# Patient Record
Sex: Female | Born: 1945 | Race: White | Hispanic: No | Marital: Married | Smoking: Never smoker
Health system: Southern US, Community
[De-identification: ages and names within clinical notes are randomized; demographics above are authoritative.]

## PROBLEM LIST (undated history)

## (undated) DIAGNOSIS — Z9109 Other allergy status, other than to drugs and biological substances: Secondary | ICD-10-CM

## (undated) DIAGNOSIS — E785 Hyperlipidemia, unspecified: Secondary | ICD-10-CM

## (undated) DIAGNOSIS — I10 Essential (primary) hypertension: Secondary | ICD-10-CM

## (undated) DIAGNOSIS — M1990 Unspecified osteoarthritis, unspecified site: Secondary | ICD-10-CM

## (undated) HISTORY — PX: CATARACT EXTRACTION, BILATERAL: SHX1313

## (undated) HISTORY — DX: Unspecified osteoarthritis, unspecified site: M19.90

## (undated) HISTORY — DX: Essential (primary) hypertension: I10

## (undated) HISTORY — DX: Hyperlipidemia, unspecified: E78.5

## (undated) HISTORY — PX: RETINAL DETACHMENT SURGERY: SHX105

## (undated) HISTORY — PX: EXPLORATORY LAPAROTOMY: SUR591

## (undated) HISTORY — DX: Other allergy status, other than to drugs and biological substances: Z91.09

---

## 1977-10-30 HISTORY — PX: TENDON REPAIR: SHX5111

## 1983-10-31 HISTORY — PX: PARTIAL HYSTERECTOMY: SHX80

## 2003-10-31 HISTORY — PX: OTHER SURGICAL HISTORY: SHX169

## 2007-08-21 ENCOUNTER — Ambulatory Visit: Payer: Self-pay | Admitting: Ophthalmology

## 2007-08-21 ENCOUNTER — Other Ambulatory Visit: Payer: Self-pay

## 2007-08-28 ENCOUNTER — Ambulatory Visit: Payer: Self-pay | Admitting: Ophthalmology

## 2007-11-12 ENCOUNTER — Ambulatory Visit: Payer: Self-pay | Admitting: Family Medicine

## 2008-11-28 ENCOUNTER — Encounter: Payer: Self-pay | Admitting: Family Medicine

## 2008-12-31 ENCOUNTER — Ambulatory Visit: Payer: Self-pay | Admitting: Family Medicine

## 2008-12-31 DIAGNOSIS — I10 Essential (primary) hypertension: Secondary | ICD-10-CM | POA: Insufficient documentation

## 2008-12-31 DIAGNOSIS — E039 Hypothyroidism, unspecified: Secondary | ICD-10-CM | POA: Insufficient documentation

## 2008-12-31 DIAGNOSIS — E785 Hyperlipidemia, unspecified: Secondary | ICD-10-CM

## 2008-12-31 DIAGNOSIS — M1990 Unspecified osteoarthritis, unspecified site: Secondary | ICD-10-CM | POA: Insufficient documentation

## 2008-12-31 DIAGNOSIS — J309 Allergic rhinitis, unspecified: Secondary | ICD-10-CM | POA: Insufficient documentation

## 2009-01-05 ENCOUNTER — Ambulatory Visit: Payer: Self-pay | Admitting: Family Medicine

## 2009-01-06 LAB — CONVERTED CEMR LAB
AST: 20 units/L (ref 0–37)
Alkaline Phosphatase: 72 units/L (ref 39–117)
Chloride: 107 meq/L (ref 96–112)
GFR calc Af Amer: 108 mL/min
GFR calc non Af Amer: 90 mL/min
LDL Cholesterol: 85 mg/dL (ref 0–99)
Potassium: 4.4 meq/L (ref 3.5–5.1)
Sodium: 141 meq/L (ref 135–145)
Total Bilirubin: 0.8 mg/dL (ref 0.3–1.2)
Total CHOL/HDL Ratio: 3.8

## 2009-01-12 ENCOUNTER — Encounter: Payer: Self-pay | Admitting: Family Medicine

## 2009-03-19 ENCOUNTER — Ambulatory Visit: Payer: Self-pay | Admitting: Family Medicine

## 2009-03-19 LAB — HM COLONOSCOPY

## 2009-03-19 LAB — CONVERTED CEMR LAB
HDL goal, serum: 40 mg/dL
LDL Goal: 130 mg/dL

## 2009-03-26 ENCOUNTER — Ambulatory Visit: Payer: Self-pay | Admitting: Family Medicine

## 2009-03-26 LAB — CONVERTED CEMR LAB: Fecal Occult Bld: POSITIVE

## 2009-05-28 ENCOUNTER — Ambulatory Visit: Payer: Self-pay | Admitting: Family Medicine

## 2009-05-28 ENCOUNTER — Encounter: Payer: Self-pay | Admitting: Family Medicine

## 2009-06-18 ENCOUNTER — Ambulatory Visit: Payer: Self-pay | Admitting: Family Medicine

## 2009-06-21 LAB — CONVERTED CEMR LAB: Glucose, Bld: 99 mg/dL (ref 70–99)

## 2010-01-26 ENCOUNTER — Ambulatory Visit: Payer: Self-pay | Admitting: Orthopedic Surgery

## 2010-02-14 ENCOUNTER — Encounter: Payer: Self-pay | Admitting: Family Medicine

## 2010-06-14 ENCOUNTER — Ambulatory Visit: Payer: Self-pay | Admitting: Family Medicine

## 2010-06-14 DIAGNOSIS — M25569 Pain in unspecified knee: Secondary | ICD-10-CM | POA: Insufficient documentation

## 2010-06-15 LAB — CONVERTED CEMR LAB
AST: 22 units/L (ref 0–37)
Albumin: 4.4 g/dL (ref 3.5–5.2)
Alkaline Phosphatase: 69 units/L (ref 39–117)
Bilirubin, Direct: 0.1 mg/dL (ref 0.0–0.3)
Calcium: 9.4 mg/dL (ref 8.4–10.5)
Cholesterol: 182 mg/dL (ref 0–200)
Creatinine, Ser: 0.8 mg/dL (ref 0.4–1.2)
GFR calc non Af Amer: 80.85 mL/min (ref >60)
Glucose, Bld: 81 mg/dL (ref 70–99)
LDL Cholesterol: 112 mg/dL — ABNORMAL HIGH (ref 0–99)
Sodium: 142 meq/L (ref 135–145)
Total CHOL/HDL Ratio: 4
Triglycerides: 121 mg/dL (ref 0.0–149.0)

## 2010-06-17 ENCOUNTER — Telehealth: Payer: Self-pay | Admitting: Family Medicine

## 2010-06-17 ENCOUNTER — Encounter: Payer: Self-pay | Admitting: Family Medicine

## 2010-08-05 LAB — HM MAMMOGRAPHY: HM Mammogram: NORMAL

## 2010-08-10 ENCOUNTER — Ambulatory Visit: Payer: Self-pay | Admitting: Family Medicine

## 2010-08-10 ENCOUNTER — Encounter: Payer: Self-pay | Admitting: Family Medicine

## 2010-08-15 ENCOUNTER — Encounter (INDEPENDENT_AMBULATORY_CARE_PROVIDER_SITE_OTHER): Payer: Self-pay | Admitting: *Deleted

## 2010-08-16 ENCOUNTER — Encounter: Payer: Self-pay | Admitting: Family Medicine

## 2010-08-16 ENCOUNTER — Ambulatory Visit: Payer: Self-pay | Admitting: Family Medicine

## 2010-08-18 ENCOUNTER — Ambulatory Visit: Payer: Self-pay | Admitting: Family Medicine

## 2010-11-29 NOTE — Medication Information (Signed)
Summary: Clarification for Simvastatin/[facility]  Clarification for Simvastatin/[facility]   Imported By: Lanelle Bal 06/22/2010 11:59:46  _____________________________________________________________________  External Attachment:    Type:   Image     Comment:   External Document

## 2010-11-29 NOTE — Letter (Signed)
Summary: Results Follow up Letter  [facility]  [address] [place], [place] [address]   Phone: [phone]  Fax: [phone]    08/15/2010 MRN: [id number]     Leah Arnold [address] [place], [place]  [address]      Dear Ms. Leah Arnold,  The following are the results of your recent test(s):  Test         Result    Pap Smear:        Normal _____  Not Normal _____ Comments: ______________________________________________________ Cholesterol: LDL(Bad cholesterol):         Your goal is less than:         HDL (Good cholesterol):       Your goal is more than: Comments:  ______________________________________________________ Mammogram:        Normal __x___  Not Normal _____ Comments: Repeat in 1 year  ___________________________________________________________________ Hemoccult:        Normal _____  Not normal _______ Comments:    _____________________________________________________________________ Other Tests:    We routinely do not discuss normal results over the telephone.  If you desire a copy of the results, or you have any questions about this information we can discuss them at your next office visit.   Sincerely,   Kerby Nora MD

## 2010-11-29 NOTE — Assessment & Plan Note (Signed)
Summary: MEDS REFILL / LFW   Vital Signs:  Patient profile:   65 year old female Height:      66.25 inches Weight:      231.0 pounds BMI:     37.14 Temp:     98.7 degrees F oral Pulse rate:   72 / minute Pulse rhythm:   regular BP sitting:   140 / 90  (left arm) Cuff size:   large  Vitals Entered By: Benny Lennert CMA Duncan Dull) (June 14, 2010 9:35 AM)  History of Present Illness: Chief complaint medication refills  Hypothyroidism, well controlled..last year... Due for reeval.   Overdue for CPX.  Currently seeing Podiatrist (Dr. Al Corpus)... has had orthotics placed.  Has seen Ortho in last year...for right knee replacement follow up..knee X-rays stable. Larey Seat several days ago..hit right knee...direct contact.  No head injury. HAs been icing..minimal pain.    Hypertension History:      She complains of peripheral edema, but denies headache, chest pain, PND, syncope, and side effects from treatment.  Has not taken med yet today.Marland KitchenMarland KitchenAt home 123/73.        Positive major cardiovascular risk factors include female age 26 years old or older, hyperlipidemia, and hypertension.  Negative major cardiovascular risk factors include non-tobacco-user status.    Lipid Management History:      Positive NCEP/ATP III risk factors include female age 27 years old or older, HDL cholesterol less than 40, and hypertension.  Negative NCEP/ATP III risk factors include non-tobacco-user status.        Her compliance with the TLC diet is good.  Comments include: Told by her husband's cardiologist to ecrease simvastaitn since on amlodi[pine as well. .      Problems Prior to Update: 1)  Other Screening Mammogram  (ICD-V76.12) 2)  Family History of Cad Female 1st Degree Relative <50  (ICD-V17.3) 3)  Allergic Rhinitis  (ICD-477.9) 4)  Osteoarthritis  (ICD-715.90) 5)  Hypertension  (ICD-401.9) 6)  Hyperlipidemia  (ICD-272.4) 7)  Unspecified Hypothyroidism  (ICD-244.9)  Current Medications (verified): 1)   Amlodipine Besylate 5 Mg Tabs (Amlodipine Besylate) .... Take 1 Tablet By Mouth Once A Day 2)  Simvastatin 40 Mg Tabs (Simvastatin) .... Take 1/2  Tablet By Mouth Once A Day 3)  Synthroid 125 Mcg Tabs (Levothyroxine Sodium) .... Brand Name Only Take 1 Tablet By Mouth Once A Day 4)  Glucosamine-Chondroitin   Caps (Glucosamine-Chondroit-Vit C-Mn) .... Take 1 Tablet By Mouth Once A Day 5)  Calcium Carbonate-Vitamin D 600-400 Mg-Unit  Tabs (Calcium Carbonate-Vitamin D) .... Take 1 Tablet By Mouth Once A Day 6)  Benazepril Hcl 10 Mg Tabs (Benazepril Hcl) .... Take 1 Tablet By Mouth Once A Day  Allergies: 1)  ! Demerol  Past History:  Past medical, surgical, family and social histories (including risk factors) reviewed, and no changes noted (except as noted below).  Past Medical History: Reviewed history from 12/31/2008 and no changes required. Hyperlipidemia Hypertension Osteoarthritis Allergic rhinitis  Past Surgical History: Reviewed history from 12/31/2008 and no changes required. 2005 right knee replacement 1979 tendon repair left wrist 1985 partial hysterectomy, left ovary remains for endometriosis and fibroids 1990 exp lap, LOA 1992/1993 carpal tunnel 2008 detached retina, macular hole cataract repair B  Family History: Reviewed history from 12/31/2008 and no changes required. mother: osteoporosis, RA father: massive MI age 20, COPD, alcoholic, esophageal cancer 2 sister: imbedded appendix both  thyroid issues Family History of CAD Female 1st degree relative <50 PGF: CAD  Social History: Reviewed  history from 12/31/2008 and no changes required. Occupation: retired Dispensing optician Married 2 children Never Smoked Alcohol use-yes, wine monthly Drug use-no Regular exercise-yes, bicycles three to 7  times a week  Review of Systems General:  Denies fatigue and fever. CV:  Denies chest pain or discomfort. Resp:  Denies shortness of breath. GI:  Denies abdominal  pain. GU:  Denies dysuria. MS:  Denies low back pain, mid back pain, muscle aches, muscle, and cramps. Psych:  Denies anxiety and depression; Some increase in stress lately with her husband's health.  Physical Exam  General:  overweight female in NAD Mouth:  Oral mucosa and oropharynx without lesions or exudates.  Teeth in good repair. Neck:  no carotid bruit or thyromegaly no cervical or supraclavicular lymphadenopathy  Lungs:  Normal respiratory effort, chest expands symmetrically. Lungs are clear to auscultation, no crackles or wheezes. Heart:  Normal rate and regular rhythm. S1 and S2 normal without gallop, murmur, click, rub or other extra sounds. Abdomen:  Bowel sounds positive,abdomen soft and non-tender without masses, organomegaly or hernias noted. Msk:  contusion on right anterior knee..neg ant post drawer, ligaments stable, no joint line tenderness, full ROM Pulses:  R and L posterior tibial pulses are full and equal bilaterally  Extremities:  1+ left pedal edema and trace right pedal edema.     Impression & Recommendations:  Problem # 1:  KNEE PAIN, RIGHT (ICD-719.46) Contusion. Prostesis appears stable.  Elevate, ice, time.   Problem # 2:  HYPERTENSION (ICD-401.9) Well controlled. Continue current medication.  Her updated medication list for this problem includes:    Amlodipine Besylate 5 Mg Tabs (Amlodipine besylate) .Marland Kitchen... Take 1 tablet by mouth once a day    Benazepril Hcl 10 Mg Tabs (Benazepril hcl) .Marland Kitchen... Take 1 tablet by mouth once a day  Orders: TLB-BMP (Basic Metabolic Panel-BMET) (80048-METABOL) TLB-Lipid Panel (80061-LIPID) TLB-Hepatic/Liver Function Pnl (80076-HEPATIC)  Problem # 3:  HYPERLIPIDEMIA (ICD-272.4) On lower dose simvastain due to interaction with CaChannel blocker..due for reeval to see if remains controlled.  Her updated medication list for this problem includes:    Simvastatin 40 Mg Tabs (Simvastatin) .Marland Kitchen... Take 1/2  tablet by mouth once a  day  Problem # 4:  UNSPECIFIED HYPOTHYROIDISM (ICD-244.9) Due for reeval.  Her updated medication list for this problem includes:    Synthroid 125 Mcg Tabs (Levothyroxine sodium) ..... Brand name only take 1 tablet by mouth once a day  Orders: TLB-TSH (Thyroid Stimulating Hormone) (84443-TSH)  Complete Medication List: 1)  Amlodipine Besylate 5 Mg Tabs (Amlodipine besylate) .... Take 1 tablet by mouth once a day 2)  Simvastatin 40 Mg Tabs (Simvastatin) .... Take 1/2  tablet by mouth once a day 3)  Synthroid 125 Mcg Tabs (Levothyroxine sodium) .... Brand name only take 1 tablet by mouth once a day 4)  Glucosamine-chondroitin Caps (Glucosamine-chondroit-vit c-mn) .... Take 1 tablet by mouth once a day 5)  Calcium Carbonate-vitamin D 600-400 Mg-unit Tabs (Calcium carbonate-vitamin d) .... Take 1 tablet by mouth once a day 6)  Benazepril Hcl 10 Mg Tabs (Benazepril hcl) .... Take 1 tablet by mouth once a day  Other Orders: Radiology Referral (Radiology)  Hypertension Assessment/Plan:      The patient's hypertensive risk group is category B: At least one risk factor (excluding diabetes) with no target organ damage.  Her calculated 10 year risk of coronary heart disease is 13 %.  Today's blood pressure is 140/90.  Her blood pressure goal is < 140/90.  Lipid  Assessment/Plan:      Based on NCEP/ATP III, the patient's risk factor category is "2 or more risk factors and a calculated 10 year CAD risk of < 20%".  The patient's lipid goals are as follows: Total cholesterol goal is 200; LDL cholesterol goal is 130; HDL cholesterol goal is 40; Triglyceride goal is 150.  Her LDL cholesterol goal has been met.    Patient Instructions: 1)  Schedule complete physical exam in next 1-2 month. 2)  We will refill you meds when labs are back.  3)  Referral Appointment Information 4)  Day/Date: 5)  Time: 6)  Place/MD: 7)  Address: 8)  Phone/Fax: 9)  Patient given appointment information. Information/Orders  faxed/mailed.   Current Allergies (reviewed today): ! DEMEROL

## 2010-11-29 NOTE — Progress Notes (Signed)
Summary: forms regarding amlodipine and simvastatin  Phone Note From Pharmacy   Caller: [facility] Summary of Call: Form from [facility] regarding drug interaction between amlodipine and simvastatin is on your desk.  Also, there is another form regarding quantity on simvastatin. Initial call taken by: Lowella Petties CMA,  June 17, 2010 9:42 AM  Follow-up for Phone Call        She has already decreased dose. will address dosing form.  Follow-up by: Kerby Nora MD,  June 17, 2010 9:56 AM

## 2010-11-29 NOTE — Assessment & Plan Note (Signed)
Summary: BEDSOLE FLU SHOT  Nurse Visit   Vitals Entered By: Benny Lennert CMA Duncan Dull) (August 18, 2010 10:40 AM)  Allergies: 1)  ! Demerol  Immunizations Administered:  Influenza Vaccine # 1:    Vaccine Type: Fluvax MCR    Site: left deltoid    Mfr: GlaxoSmithKline    Dose: 0.5 ml    Route: IM    Given by: Benny Lennert CMA (AAMA)    Exp. Date: 04/29/2011    Lot #: [id number]    VIS given: 05/24/10 version given August 18, 2010.  Flu Vaccine Consent Questions:    Do you have a history of severe allergic reactions to this vaccine? no    Any prior history of allergic reactions to egg and/or gelatin? no    Do you have a sensitivity to the preservative Thimersol? no    Do you have a past history of Guillan-Barre Syndrome? no    Do you currently have an acute febrile illness? no    Have you ever had a severe reaction to latex? no    Vaccine information given and explained to patient? yes    Are you currently pregnant? no  Orders Added: 1)  Influenza Vaccine MCR [00025] 2)  Flu Vaccine 51yrs + MEDICARE PATIENTS [Q2039]

## 2011-04-17 ENCOUNTER — Encounter: Payer: Self-pay | Admitting: Family Medicine

## 2011-04-17 ENCOUNTER — Ambulatory Visit (INDEPENDENT_AMBULATORY_CARE_PROVIDER_SITE_OTHER): Payer: Medicare PPO | Admitting: Family Medicine

## 2011-04-17 VITALS — BP 130/80 | HR 107 | Temp 98.0°F | Ht 66.5 in | Wt 231.0 lb

## 2011-04-17 DIAGNOSIS — N12 Tubulo-interstitial nephritis, not specified as acute or chronic: Secondary | ICD-10-CM

## 2011-04-17 DIAGNOSIS — R300 Dysuria: Secondary | ICD-10-CM

## 2011-04-17 DIAGNOSIS — N390 Urinary tract infection, site not specified: Secondary | ICD-10-CM

## 2011-04-17 LAB — POCT URINALYSIS DIPSTICK
Bilirubin, UA: NEGATIVE
Ketones, UA: NEGATIVE
Protein, UA: 30
Spec Grav, UA: 1.015
pH, UA: 7.5

## 2011-04-17 MED ORDER — CIPROFLOXACIN HCL 250 MG PO TABS: 250.0000 mg | ORAL_TABLET | Freq: Two times a day (BID) | ORAL | Status: AC

## 2011-04-17 NOTE — Progress Notes (Signed)
65 year old with UTI symptoms:  Feels like a truck ran over her, burning when she goes - small amounts of urine when she goes. Cranberry and has gotten worse and worse.  Subjective:    Leah Arnold is a 65 y.o. female who complains of burning with urination, dysuria, frequency, hesitancy and incomplete bladder emptying. She has had symptoms for 4 days. Patient also complains of back pain. Patient denies congestion, cough, fever, headache, rhinitis, sorethroat, stomach ache and vaginal discharge. Patient does not have a history of recurrent UTI. Patient does have a history of pyelonephritis.   Patient Active Problem List  Diagnoses  . UNSPECIFIED HYPOTHYROIDISM  . HYPERLIPIDEMIA  . HYPERTENSION  . ALLERGIC RHINITIS  . OSTEOARTHRITIS  . KNEE PAIN, RIGHT   Past Medical History  Diagnosis Date  . Pollen allergies   . HTN (hypertension)   . Osteoarthritis   . Hyperlipidemia    Past Surgical History  Procedure Date  . Right knee replacement 2005  . Tendon repair 1979    left wrist  . Partial hysterectomy 1985    Left ovary remains for endometriosis and fibroids  . Exploratory laparotomy   . Retinal detachment surgery   . Cataract extraction, bilateral    History  Substance Use Topics  . Smoking status: Never Smoker   . Smokeless tobacco: Not on file  . Alcohol Use: Yes   Family History  Problem Relation Age of Onset  . Osteoporosis Mother   . Arthritis Mother   . Alcohol abuse Father   . COPD Father   . Cancer Father     esophageal  . Heart attack Father 49    massive  . Coronary artery disease Paternal Grandfather    Allergies  Allergen Reactions  . Meperidine Hcl     REACTION: Nausea   No current outpatient prescriptions on file prior to visit.     Review of Systems ROS: GEN: Acute illness details above GI: Tolerating PO intake GU: maintaining adequate hydration and urination Pulm: No SOB Interactive and getting along well at home.  Otherwise, ROS is as  per the HPI.    Objective:    Physical Exam  Blood pressure 130/80, pulse 107, temperature 98 F (36.7 C), temperature source Oral, height 5' 6.5" (1.689 m), weight 231 lb (104.781 kg), SpO2 98.00%.  GEN: WDWN, A&Ox4,NAD. Non-toxic HEENT: Atraumatc, normocephalic. CV: RRR, No M/G/R PULM: CTA B, No wheezes, crackles, or rhonchi ABD: S, NT, ND, +BS, no rebound. No CVAT. No suprapubic tenderness. Mild CVAT EXT: No c/c/e    Laboratory:  Urine dipstick: see UA, + lg blood and le.   Micro exam: not done.    Assessment:    Acute cystitis   May be early pyelo - will treat longer   Plan:    Medications: ciprofloxacin.  fluids

## 2011-08-03 ENCOUNTER — Encounter: Payer: Self-pay | Admitting: Family Medicine

## 2011-08-03 ENCOUNTER — Ambulatory Visit (INDEPENDENT_AMBULATORY_CARE_PROVIDER_SITE_OTHER): Payer: Medicare PPO | Admitting: Family Medicine

## 2011-08-03 VITALS — BP 130/70 | HR 57 | Temp 98.7°F | Ht 66.0 in | Wt 224.8 lb

## 2011-08-03 DIAGNOSIS — I10 Essential (primary) hypertension: Secondary | ICD-10-CM

## 2011-08-03 DIAGNOSIS — E039 Hypothyroidism, unspecified: Secondary | ICD-10-CM

## 2011-08-03 DIAGNOSIS — Z23 Encounter for immunization: Secondary | ICD-10-CM

## 2011-08-03 DIAGNOSIS — Z1231 Encounter for screening mammogram for malignant neoplasm of breast: Secondary | ICD-10-CM

## 2011-08-03 DIAGNOSIS — E785 Hyperlipidemia, unspecified: Secondary | ICD-10-CM

## 2011-08-03 LAB — COMPREHENSIVE METABOLIC PANEL
Alkaline Phosphatase: 74 U/L (ref 39–117)
CO2: 27 mEq/L (ref 19–32)
Creatinine, Ser: 0.7 mg/dL (ref 0.4–1.2)
GFR: 83.09 mL/min (ref >60.00)
Glucose, Bld: 104 mg/dL — ABNORMAL HIGH (ref 70–99)
Sodium: 142 mEq/L (ref 135–145)
Total Bilirubin: 0.6 mg/dL (ref 0.3–1.2)
Total Protein: 7.5 g/dL (ref 6.0–8.3)

## 2011-08-03 LAB — LIPID PANEL
HDL: 49.7 mg/dL (ref >39.00)
Total CHOL/HDL Ratio: 3
Triglycerides: 90 mg/dL (ref 0.0–149.0)
VLDL: 18 mg/dL (ref 0.0–40.0)

## 2011-08-03 LAB — TSH: TSH: 2.23 u[IU]/mL (ref 0.35–5.50)

## 2011-08-03 MED ORDER — BENAZEPRIL HCL 10 MG PO TABS: 10.0000 mg | ORAL_TABLET | Freq: Every day | ORAL | Status: DC

## 2011-08-03 MED ORDER — AMLODIPINE BESYLATE 5 MG PO TABS: 5.0000 mg | ORAL_TABLET | Freq: Every day | ORAL | Status: DC

## 2011-08-03 MED ORDER — LEVOTHYROXINE SODIUM 125 MCG PO TABS: 125.0000 ug | ORAL_TABLET | Freq: Every day | ORAL | Status: DC

## 2011-08-03 NOTE — Assessment & Plan Note (Signed)
Due for re-eval. 

## 2011-08-03 NOTE — Assessment & Plan Note (Signed)
Well controlled. Continue current medication. If BPs continue to come down with weight loss.. We may be able to decrease medication.

## 2011-08-03 NOTE — Progress Notes (Signed)
  Subjective:    Patient ID: Leah Arnold, female    DOB: May 29, 1946, 65 y.o.   MRN: [id number]  HPI  Hypertension:  Well controlled on benazapril and amlodipine. Using medication without problems or lightheadedness: None Chest pain with exertion:None Edema:None Short of breath:None Average home BPs: 110-120/70s Other issues: Has been working on weight loss and lifestyle change. Has lost 8 lbs since last OV.  Elevated Cholesterol: Due for recheck... On 20 mg daily of simvastatin. Cannot increase given on amlodipine. Using medications without problems:None Muscle aches: None Diet compliance: Great Exercise:Great Other complaints:  Hypothyroid, Stable on current dose. Lab Results  Component Value Date   TSH 5.33 06/14/2010   Over due for yearly physical...will schedule AMW.    Review of Systems  Constitutional: Negative for fever and fatigue.  HENT: Negative for ear pain.   Eyes: Negative for pain.  Respiratory: Negative for chest tightness and shortness of breath.   Cardiovascular: Negative for chest pain, palpitations and leg swelling.  Gastrointestinal: Negative for abdominal pain.  Genitourinary: Negative for dysuria.       Objective:   Physical Exam  Constitutional: Vital signs are normal. She appears well-developed and well-nourished. She is cooperative.  Non-toxic appearance. She does not appear ill. No distress.  HENT:  Head: Normocephalic.  Right Ear: Hearing, tympanic membrane, external ear and ear canal normal. Tympanic membrane is not erythematous, not retracted and not bulging.  Left Ear: Hearing, tympanic membrane, external ear and ear canal normal. Tympanic membrane is not erythematous, not retracted and not bulging.  Nose: No mucosal edema or rhinorrhea. Right sinus exhibits no maxillary sinus tenderness and no frontal sinus tenderness. Left sinus exhibits no maxillary sinus tenderness and no frontal sinus tenderness.  Mouth/Throat: Uvula is midline, oropharynx  is clear and moist and mucous membranes are normal.  Eyes: Conjunctivae, EOM and lids are normal. Pupils are equal, round, and reactive to light. No foreign bodies found.  Neck: Trachea normal and normal range of motion. Neck supple. Carotid bruit is not present. No mass and no thyromegaly present.  Cardiovascular: Normal rate, regular rhythm, S1 normal, S2 normal, normal heart sounds, intact distal pulses and normal pulses.  Exam reveals no gallop and no friction rub.   No murmur heard.      R 1 plus np edemsa, 2 plus on left  Due to capsulitits in this area  Pulmonary/Chest: Effort normal and breath sounds normal. Not tachypneic. No respiratory distress. She has no decreased breath sounds. She has no wheezes. She has no rhonchi. She has no rales.  Abdominal: Soft. Normal appearance and bowel sounds are normal. There is no tenderness.  Neurological: She is alert.  Skin: Skin is warm, dry and intact. No rash noted.  Psychiatric: Her speech is normal and behavior is normal. Judgment and thought content normal. Her mood appears not anxious. Cognition and memory are normal. She does not exhibit a depressed mood.          Assessment & Plan:

## 2011-08-03 NOTE — Patient Instructions (Addendum)
Stop front desk to set up mammogram.

## 2011-10-04 ENCOUNTER — Ambulatory Visit: Payer: Self-pay | Admitting: Family Medicine

## 2011-10-04 ENCOUNTER — Encounter: Payer: Self-pay | Admitting: Family Medicine

## 2011-10-05 ENCOUNTER — Encounter: Payer: Medicare PPO | Admitting: Family Medicine

## 2011-10-05 ENCOUNTER — Encounter: Payer: Self-pay | Admitting: *Deleted

## 2011-10-30 ENCOUNTER — Encounter: Payer: Medicare PPO | Admitting: Family Medicine

## 2012-08-13 ENCOUNTER — Encounter: Payer: Self-pay | Admitting: Family Medicine

## 2012-08-13 ENCOUNTER — Ambulatory Visit (INDEPENDENT_AMBULATORY_CARE_PROVIDER_SITE_OTHER): Payer: Medicare PPO | Admitting: Family Medicine

## 2012-08-13 VITALS — BP 138/80 | HR 88 | Temp 98.6°F | Wt 197.2 lb

## 2012-08-13 DIAGNOSIS — E039 Hypothyroidism, unspecified: Secondary | ICD-10-CM

## 2012-08-13 DIAGNOSIS — E785 Hyperlipidemia, unspecified: Secondary | ICD-10-CM

## 2012-08-13 DIAGNOSIS — M1990 Unspecified osteoarthritis, unspecified site: Secondary | ICD-10-CM

## 2012-08-13 DIAGNOSIS — Z23 Encounter for immunization: Secondary | ICD-10-CM

## 2012-08-13 DIAGNOSIS — I10 Essential (primary) hypertension: Secondary | ICD-10-CM

## 2012-08-13 LAB — COMPREHENSIVE METABOLIC PANEL
ALT: 14 U/L (ref 0–35)
Albumin: 3.9 g/dL (ref 3.5–5.2)
CO2: 27 mEq/L (ref 19–32)
Calcium: 9.3 mg/dL (ref 8.4–10.5)
Chloride: 106 mEq/L (ref 96–112)
GFR: 74.63 mL/min (ref >60.00)
Glucose, Bld: 86 mg/dL (ref 70–99)
Sodium: 140 mEq/L (ref 135–145)
Total Bilirubin: 0.5 mg/dL (ref 0.3–1.2)
Total Protein: 7.1 g/dL (ref 6.0–8.3)

## 2012-08-13 LAB — LIPID PANEL
Cholesterol: 153 mg/dL (ref 0–200)
VLDL: 14 mg/dL (ref 0.0–40.0)

## 2012-08-13 LAB — TSH: TSH: 0.45 u[IU]/mL (ref 0.35–5.50)

## 2012-08-13 MED ORDER — SIMVASTATIN 40 MG PO TABS: 20.0000 mg | ORAL_TABLET | Freq: Every day | ORAL | Status: DC

## 2012-08-13 MED ORDER — MELOXICAM 7.5 MG PO TABS: 7.5000 mg | ORAL_TABLET | Freq: Every day | ORAL | Status: DC

## 2012-08-13 MED ORDER — LEVOTHYROXINE SODIUM 125 MCG PO TABS: 125.0000 ug | ORAL_TABLET | Freq: Every day | ORAL | Status: DC

## 2012-08-13 MED ORDER — AMLODIPINE BESYLATE 5 MG PO TABS: 5.0000 mg | ORAL_TABLET | Freq: Every day | ORAL | Status: DC

## 2012-08-13 MED ORDER — BENAZEPRIL HCL 10 MG PO TABS: 10.0000 mg | ORAL_TABLET | Freq: Every day | ORAL | Status: DC

## 2012-08-13 NOTE — Patient Instructions (Addendum)
Schedule annual medicare wellness in next few month with no labs prior.  Trial of meloxicam for arthritis pain. Take along with  20-40 mg prilosec. Take with food. Stop if stomach upset.  Can try topical voltaren gel if above regimen not tolerated.

## 2012-08-13 NOTE — Progress Notes (Signed)
Subjective:    Patient ID: Leah Arnold, female    DOB: Jan 19, 1946, 67 y.o.   MRN: [id number]  HPI Had detached retina in left eye, s/p surgery 12/2011.  Husband with parkinson's disease.. Very dependant on her.  Hypertension: Well controlled on benazapril and amlodipine.  Using medication without problems or lightheadedness: None  Chest pain with exertion:None  Edema:None  Short of breath:None  Average home BPs: 120/80 Other issues:  Has lost 25-30 lbs in last year Wt Readings from Last 3 Encounters:  08/13/12 197 lb 4 oz (89.472 kg)  08/03/11 224 lb 12.8 oz (101.969 kg)  04/17/11 231 lb (104.781 kg)     Elevated Cholesterol:  Due for re-check Lab Results  Component Value Date   CHOL 152 08/03/2011   HDL 49.70 08/03/2011   LDLCALC 84 08/03/2011   TRIG 90.0 08/03/2011   CHOLHDL 3 08/03/2011  On 20 mg daily of simvastatin. Cannot increase given on amlodipine.  Using medications without problems:None  Muscle aches: None  Diet compliance: Great  Exercise:Great, indoor bicycle Other complaints:   Hypothyroid, due for re-check Lab Results  Component Value Date   TSH 2.23 08/03/2011     Over due for yearly physical...will schedule AMW.        Review of Systems  Constitutional: Negative for fever and fatigue.  HENT: Negative for ear pain.   Eyes: Negative for pain.  Respiratory: Negative for chest tightness and shortness of breath.   Cardiovascular: Negative for chest pain, palpitations and leg swelling.  Gastrointestinal: Negative for abdominal pain.  Genitourinary: Negative for dysuria.       Objective:   Physical Exam  Constitutional: Vital signs are normal. She appears well-developed and well-nourished. She is cooperative.  Non-toxic appearance. She does not appear ill. No distress.       obesity  HENT:  Head: Normocephalic.  Right Ear: Hearing, tympanic membrane, external ear and ear canal normal. Tympanic membrane is not erythematous, not retracted and not  bulging.  Left Ear: Hearing, tympanic membrane, external ear and ear canal normal. Tympanic membrane is not erythematous, not retracted and not bulging.  Nose: No mucosal edema or rhinorrhea. Right sinus exhibits no maxillary sinus tenderness and no frontal sinus tenderness. Left sinus exhibits no maxillary sinus tenderness and no frontal sinus tenderness.  Mouth/Throat: Uvula is midline, oropharynx is clear and moist and mucous membranes are normal.  Eyes: Conjunctivae normal, EOM and lids are normal. Pupils are equal, round, and reactive to light. No foreign bodies found.  Neck: Trachea normal and normal range of motion. Neck supple. Carotid bruit is not present. No mass and no thyromegaly present.  Cardiovascular: Normal rate, regular rhythm, S1 normal, S2 normal, normal heart sounds, intact distal pulses and normal pulses.  Exam reveals no gallop and no friction rub.   No murmur heard.      B varicose veins No peripheral edema  Pulmonary/Chest: Effort normal and breath sounds normal. Not tachypneic. No respiratory distress. She has no decreased breath sounds. She has no wheezes. She has no rhonchi. She has no rales.  Abdominal: Soft. Normal appearance and bowel sounds are normal. There is no tenderness.  Neurological: She is alert.  Skin: Skin is warm, dry and intact. No rash noted.  Psychiatric: Her speech is normal and behavior is normal. Judgment and thought content normal. Her mood appears not anxious. Cognition and memory are normal. She does not exhibit a depressed mood.          Assessment &  Plan:

## 2012-08-13 NOTE — Assessment & Plan Note (Signed)
Due for re-eval. 

## 2012-08-13 NOTE — Assessment & Plan Note (Signed)
Well controlled. Continue current medication.  

## 2012-08-13 NOTE — Assessment & Plan Note (Signed)
Trial of meloxicam with prilose to avoid stomach irritation she has had with aleve in past. If not tolerating we can try topical votaren gel for [facility] and foot arthritis.

## 2012-08-13 NOTE — Addendum Note (Signed)
Addended by: Sydell Axon C on: 08/13/2012 09:00 AM   Modules accepted: Orders

## 2012-08-15 MED ORDER — SIMVASTATIN 40 MG PO TABS: 20.0000 mg | ORAL_TABLET | Freq: Every day | ORAL | Status: DC

## 2012-08-15 MED ORDER — AMLODIPINE BESYLATE 5 MG PO TABS: 5.0000 mg | ORAL_TABLET | Freq: Every day | ORAL | Status: DC

## 2012-08-15 MED ORDER — BENAZEPRIL HCL 10 MG PO TABS: 10.0000 mg | ORAL_TABLET | Freq: Every day | ORAL | Status: DC

## 2012-08-15 MED ORDER — SYNTHROID 125 MCG PO TABS: 125.0000 ug | ORAL_TABLET | Freq: Every day | ORAL | Status: DC

## 2012-08-15 NOTE — Addendum Note (Signed)
Addended by: Baldomero Lamy on: 08/15/2012 08:53 AM   Modules accepted: Orders

## 2012-08-19 ENCOUNTER — Other Ambulatory Visit: Payer: Self-pay

## 2012-08-19 NOTE — Telephone Encounter (Signed)
Pt said [facility] left message for pt they are needing additional information before [facility] can process refills. Pt is not sure which med [facility] needs more info on. I asked pt to contact [facility] to request info sent to Dr Ermalene Searing. I cannot find a request for add'l info. Pt said thanks a lot and hung up phone. I called pt back and explained that if I knew what additional info was needed I would try to get that info sent back to [facility]. Pt said she is in the middle and she will try to contact [facility] and ask them to refax info to our office. Pt said fax # [facility] left was [phone] when our office finds out what info needs to be sent.

## 2013-08-07 ENCOUNTER — Ambulatory Visit (INDEPENDENT_AMBULATORY_CARE_PROVIDER_SITE_OTHER): Payer: Medicare PPO

## 2013-08-07 DIAGNOSIS — Z23 Encounter for immunization: Secondary | ICD-10-CM

## 2013-08-11 ENCOUNTER — Ambulatory Visit: Payer: Self-pay | Admitting: Podiatry

## 2013-09-18 ENCOUNTER — Other Ambulatory Visit: Payer: Self-pay | Admitting: Family Medicine

## 2014-01-11 ENCOUNTER — Other Ambulatory Visit: Payer: Self-pay | Admitting: Family Medicine

## 2014-01-21 ENCOUNTER — Encounter: Payer: Self-pay | Admitting: Podiatry

## 2014-01-21 ENCOUNTER — Ambulatory Visit (INDEPENDENT_AMBULATORY_CARE_PROVIDER_SITE_OTHER): Payer: Medicare PPO | Admitting: Podiatry

## 2014-01-21 VITALS — BP 136/88 | HR 112 | Resp 16 | Ht 66.0 in | Wt 208.0 lb

## 2014-01-21 DIAGNOSIS — M778 Other enthesopathies, not elsewhere classified: Secondary | ICD-10-CM

## 2014-01-21 DIAGNOSIS — M7750 Other enthesopathy of unspecified foot: Secondary | ICD-10-CM

## 2014-01-21 DIAGNOSIS — M779 Enthesopathy, unspecified: Principal | ICD-10-CM

## 2014-01-21 DIAGNOSIS — M722 Plantar fascial fibromatosis: Secondary | ICD-10-CM

## 2014-01-21 NOTE — Progress Notes (Signed)
Both my feet are hurting , i have plantar fasciitis in the right heel and capsulitis in the other foot.  Objective: Vital signs are stable she is alert and oriented x3. Pulses are palpable bilateral. Edema is positive to the bilateral foot. She has pain on palpation medial continued tubercle of the right heel. She has pain on palpation and in range of motion of the subtalar joint left foot. She also has pain on palpation medial continued tubercle of the left foot. Radiographs confirm plantar fasciitis.  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 Subtalar joint capsulitis left.  Plan: Discussed etiology pathology conservative versus surgical therapies. I injected the subtalar joint of the left foot with Kenalog and local anesthetic. I injected the bilateral heels with Kenalog and local anesthetic.

## 2014-01-26 ENCOUNTER — Ambulatory Visit (INDEPENDENT_AMBULATORY_CARE_PROVIDER_SITE_OTHER)
Admission: RE | Admit: 2014-01-26 | Discharge: 2014-01-26 | Disposition: A | Discharge status: Home / Self Care | Payer: Medicare PPO | Source: Ambulatory Visit | Attending: Family Medicine | Admitting: Family Medicine

## 2014-01-26 ENCOUNTER — Ambulatory Visit (INDEPENDENT_AMBULATORY_CARE_PROVIDER_SITE_OTHER): Payer: Medicare PPO | Admitting: Family Medicine

## 2014-01-26 ENCOUNTER — Encounter: Payer: Self-pay | Admitting: Family Medicine

## 2014-01-26 VITALS — BP 106/68 | HR 94 | Temp 98.0°F | Ht 66.0 in | Wt 205.2 lb

## 2014-01-26 DIAGNOSIS — M25511 Pain in right shoulder: Secondary | ICD-10-CM

## 2014-01-26 DIAGNOSIS — M25519 Pain in unspecified shoulder: Secondary | ICD-10-CM

## 2014-01-26 DIAGNOSIS — R55 Syncope and collapse: Secondary | ICD-10-CM

## 2014-01-26 DIAGNOSIS — S060X9A Concussion with loss of consciousness of unspecified duration, initial encounter: Secondary | ICD-10-CM

## 2014-01-26 DIAGNOSIS — M25571 Pain in right ankle and joints of right foot: Secondary | ICD-10-CM

## 2014-01-26 DIAGNOSIS — M25579 Pain in unspecified ankle and joints of unspecified foot: Secondary | ICD-10-CM

## 2014-01-26 DIAGNOSIS — S060XAA Concussion with loss of consciousness status unknown, initial encounter: Secondary | ICD-10-CM

## 2014-01-26 DIAGNOSIS — E039 Hypothyroidism, unspecified: Secondary | ICD-10-CM

## 2014-01-26 LAB — TSH: TSH: 6.79 u[IU]/mL — AB (ref 0.35–5.50)

## 2014-01-26 MED ORDER — SYNTHROID 125 MCG PO TABS: ORAL_TABLET | ORAL | Status: DC

## 2014-01-26 NOTE — Patient Instructions (Signed)
Follow-up with Dr. Patsy Lager[place], 3 weeks

## 2014-01-26 NOTE — Progress Notes (Signed)
Date:  01/26/2014   Name:  Leah Arnold   DOB:  [birth year]   MRN:  [id number]  Primary Physician:  Kerby Nora, MD   Chief Complaint: Fall   Subjective:   History of Present Illness:  Leah Arnold is a 68 y.o. pleasant patient who presents with the following:  Fall after acute syncopal event after food poisoning with dehydration.  DOI 01/23/2014.  Had been sick with food poisoning, wed - Thursday last week. Had been drinking water. Husband has parkinson's, severe, and she is the primary caregiver. History is obtained from patient and her daughter.   Went to the bathroom, throwing up, and husband came after him. Husband trapped foot - maybe?Micah Flesher to the bathroom, and in some way, she blacked out. Her history is not entirely clear, but she has some amnesia and haziness around the event.   Husband also fell.  She was trying to get him up. He fell on top of her and trapped her foot. She thinks this is how she fell. Minimal memory from around 9 PM then family member arrived early in the night when they could find a phone.   Foot trapped - R LE trapped.  Hit head on the R side.  Shoulder on the right side hurts quite a bit.  R LE extensively bruised and painful.  Spent 30-40 minutes trying to get free.   R lower leg hurting quite a lot.  Declined ER eval from EMT recommendations due to lack of care for husband.  Check TSH. She also reports that she has not been taking her thyroid medication as prescribed. She has been cutting her thyroid tablets and half so that they will last longer. Lab Results  Component Value Date   TSH 6.79* 01/26/2014     ? Concussion: She also has been somewhat hazy according to her daughter. When asked her questions specifically directly, she somewhat answers tangentially. She did have a headache, which has subsided. She has not had any significant nausea or emotional lability. She is not sure about feeling dizzy. She is now using a walker, and normally she does  not. This is primarily secondary to pain. She does not feel safe, she she is using a walker. At this point her headache has resolved. She has no nausea. She has no visual changes. She denies any photophobia or photophobia.  [facility] - branded Synthroid   Patient Active Problem List   Diagnosis Date Noted  . UNSPECIFIED HYPOTHYROIDISM 12/31/2008  . HYPERLIPIDEMIA 12/31/2008  . HYPERTENSION 12/31/2008  . ALLERGIC RHINITIS 12/31/2008  . OSTEOARTHRITIS 12/31/2008    Past Medical History  Diagnosis Date  . Pollen allergies   . HTN (hypertension)   . Osteoarthritis   . Hyperlipidemia     Past Surgical History  Procedure Laterality Date  . Right knee replacement  2005  . Tendon repair  1979    left wrist  . Partial hysterectomy  1985    Left ovary remains for endometriosis and fibroids  . Exploratory laparotomy    . Retinal detachment surgery    . Cataract extraction, bilateral      History   Social History  . Marital Status: Married    Spouse Name: N/A    Number of Children: 2  . Years of Education: N/A   Occupational History  . retired(workers Building services engineer)    Social History Main Topics  . Smoking status: Never Smoker   . Smokeless tobacco: Never Used  . Alcohol Use:  Yes     Comment: occassionally a glass a wine  . Drug Use: No  . Sexual Activity: Not on file   Other Topics Concern  . Not on file   Social History Narrative   Regular exercise--yes--bicycles 3 to 7 times per week    Family History  Problem Relation Age of Onset  . Osteoporosis Mother   . Arthritis Mother   . Alcohol abuse Father   . COPD Father   . Cancer Father     esophageal  . Heart attack Father [id number]    massive  . Coronary artery disease Paternal Grandfather     Allergies  Allergen Reactions  . Meperidine Hcl     REACTION: Nausea    Medication list has been reviewed and updated.  Review of Systems:   GEN: No acute illnesses, no fevers, chills. GI: No n/v/d, eating  normally Pulm: No SOB MSK and neuro changes as above Otherwise, the pertinent positives and negatives are listed above and in the HPI, otherwise a full review of systems has been reviewed and is negative unless noted positive.   Objective:   Physical Examination: BP 106/68  Pulse 94  Temp(Src) 98 F (36.7 C) (Oral)  Ht 5\' 6"  (1.676 m)  Wt 205 lb 4 oz (93.101 kg)  BMI 33.14 kg/m2  Ideal Body Weight: Weight in (lb) to have BMI = 25: 154.6   GEN: WDWN, NAD, Non-toxic, A & O x 3 HEENT: Atraumatic, Normocephalic. Neck supple. No masses, No LAD. Ears and Nose: No external deformity. CV: RRR, No M/G/R. No JVD. No thrill. No extra heart sounds. PULM: CTA B, no wheezes, crackles, rhonchi. No retractions. No resp. distress. No accessory muscle use. EXTR: No c/c/e NEURO Normal gait.  PSYCH: Normally interactive. Conversant. Not depressed or anxious appearing.  Calm demeanor.   Neuro: CN 2-12 grossly intact. PERRLA. EOMI. Sensation intact throughout. Str 5/5 all extremities - R UE eval limited by pain. DTR 2+. No clonus. A and o x 4. Finger nose neg. LE cerebellar function no tested secondary to pain in LE.  PSYCH: Normally interactive. Conversant. Not depressed or anxious appearing.  Calm demeanor.    There is some pain along the proximal humerus. There is also pain at the acromium and at the a.c. Joint. There is no significant pain along the clavicle the RIGHT. Anterior and posterior chest wall as well as a scapular nontender. Full range of motion at the elbow and this is also nontender. Nontender throughout the radius and ulna. The wrist and all bones of the hand appear to be grossly nontender.  Bilaterally, the hips have a normal log roll. They have normal range of motion without significant tenderness. The femurs are grossly palpated as well as the pelvis itself which appears to be stable and is nontender.  There is no significant swelling or tenderness about the knee.  On the RIGHT  side of the tibia there is soft tissue bruising extensively. The fibula and tibia are nontender proximally and along the midshaft.  The distal fibula is tender to palpation. Notably so and an additional area of bruising significantly.  The entirety of both feet and ankles are nontender in terms of bony anatomy with the exception of the lateral malleolus on the RIGHT.  In the RIGHT shoulder as above, and additionally there is restriction of abduction and flexion with 4 minus/5 strength testing. Internal and external range of motion is also 4/5 strength. Additional special testing cannot be undertaken secondary  to pain.  Laboratory and Imaging Data: Dg Shoulder Right  01/26/2014   CLINICAL DATA:  Right shoulder plain, trauma 3 days ago  EXAM: RIGHT SHOULDER - 2+ VIEW  COMPARISON:  None.  FINDINGS: Three views of right shoulder submitted. No acute fracture or subluxation. Glenohumeral joint is preserved.  IMPRESSION: Negative.   Electronically Signed   By: Natasha Mead M.D.   On: 01/26/2014 15:25   Dg Ankle Complete Right  01/26/2014   CLINICAL DATA:  Right ankle injury 3 days ago.  Pain.  EXAM: RIGHT ANKLE - COMPLETE 3+ VIEW  COMPARISON:  07/09/2013  FINDINGS: No fracture. No bone lesion. Ankle mortise is normally space and aligned. There is soft tissue swelling most notable laterally.  IMPRESSION: No fracture or dislocation.   Electronically Signed   By: Amie Portland M.D.   On: 01/26/2014 15:59    Assessment & Plan:   Syncope  Right ankle pain - Plan: DG Ankle Complete Right  Right shoulder pain - Plan: DG Shoulder Right  Unspecified hypothyroidism - Plan: TSH  Closed head injury with concussion  >40 minutes spent in face to face time with patient, >50% spent in counselling or coordination of care:  Complex trauma case in an elderly patient. No fracture.  At the very least soft tissue injury. Restriction in strength about the RIGHT shoulder. Rotator cuff tear, full-thickness versus  partial-thickness cannot be excluded. I will reassess in 3 weeks.  Soft tissue and bone contusion RIGHT lower extremity. Not consistent with occult fracture.  Closed head injury with concussion. Unclear if loss of consciousness occurred. Certainly amnesia and headache. She does still have some mild altered mentation according to her daughter who knows her well, but otherwise normal neuro exam. She does appear to be recovering. I reviewed all this in detail with the patient and her daughter, who will be here in town to assist, and reviewed complete neuro-cognitive and physical rest throughout the remainder of the week. I reviewed red flags as well.  He'll follow up with me in 3 weeks to reassess her shoulder.  No Follow-up on file.  Orders Placed This Encounter  Procedures  . DG Shoulder Right  . DG Ankle Complete Right  . TSH   Patient's Medications  New Prescriptions   No medications on file  Previous Medications   BENAZEPRIL (LOTENSIN) 10 MG TABLET    Take 1 tablet (10 mg total) by mouth daily.   CALCIUM CARBONATE-VITAMIN D (CALCIUM 600+D) 600-400 MG-UNIT PER TABLET    Take 1 tablet by mouth daily.     SIMVASTATIN (ZOCOR) 40 MG TABLET    Take 0.5 tablets (20 mg total) by mouth at bedtime.  Modified Medications   Modified Medication Previous Medication   SYNTHROID 125 MCG TABLET SYNTHROID 125 MCG tablet      TAKE 1 TABLET (125 MCG TOTAL) BY MOUTH DAILY.    TAKE 1 TABLET (125 MCG TOTAL) BY MOUTH DAILY.  Discontinued Medications   NAPROXEN SODIUM (ANAPROX) 220 MG TABLET    Take 220 mg by mouth 2 (two) times daily with a meal.   Patient Instructions  Follow-up with Dr. Patsy Lager, 3 weeks   Signed,  Karleen Hampshire T. Chesley Veasey, MD, CAQ Sports Medicine  [employer] at [facility] [address] [place] [address] Phone: [phone] Fax: [phone]

## 2014-01-26 NOTE — Progress Notes (Signed)
Pre visit review using our clinic review tool, if applicable. No additional management support is needed unless otherwise documented below in the visit note. 

## 2014-02-12 ENCOUNTER — Encounter: Payer: Self-pay | Admitting: Family Medicine

## 2014-02-12 ENCOUNTER — Ambulatory Visit (INDEPENDENT_AMBULATORY_CARE_PROVIDER_SITE_OTHER): Payer: Medicare PPO | Admitting: Family Medicine

## 2014-02-12 VITALS — BP 140/90 | HR 109 | Temp 98.4°F | Ht 66.0 in | Wt 201.0 lb

## 2014-02-12 DIAGNOSIS — S060XAA Concussion with loss of consciousness status unknown, initial encounter: Secondary | ICD-10-CM

## 2014-02-12 DIAGNOSIS — M25511 Pain in right shoulder: Secondary | ICD-10-CM

## 2014-02-12 DIAGNOSIS — S060X9A Concussion with loss of consciousness of unspecified duration, initial encounter: Secondary | ICD-10-CM

## 2014-02-12 DIAGNOSIS — M25579 Pain in unspecified ankle and joints of unspecified foot: Secondary | ICD-10-CM

## 2014-02-12 DIAGNOSIS — M25571 Pain in right ankle and joints of right foot: Secondary | ICD-10-CM

## 2014-02-12 DIAGNOSIS — M25519 Pain in unspecified shoulder: Secondary | ICD-10-CM

## 2014-02-12 NOTE — Progress Notes (Signed)
Date:  02/12/2014   Name:  Leah BaneMyra Arnold   DOB:  1946/05/27   MRN:  [id number]  Primary Physician:  Kerby NoraAmy Bedsole, MD   Chief Complaint: Follow-up   Subjective:   History of Present Illness:  Leah Arnold is a 68 y.o. pleasant patient who presents with the following:  Concussion fully cleared. She denies headache, blurred vision, nausea, and she is fully clear mentally. Her balance is full and normal.  R shoulder injury is fully recovered.   Legs are also nontender, no feel good.  Clear to drive.  01/26/2014 Last OV with Hannah BeatSpencer Rosa Gambale, MD  Fall after acute syncopal event after food poisoning with dehydration.  DOI 01/23/2014.  Had been sick with food poisoning, wed - Thursday last week. Had been drinking water. Husband has parkinson's, severe, and she is the primary caregiver. History is obtained from patient and her daughter.   Went to the bathroom, throwing up, and husband came after him. Husband trapped foot - maybe?Micah Flesher. Went to the bathroom, and in some way, she blacked out. Her history is not entirely clear, but she has some amnesia and haziness around the event.   Husband also fell.  She was trying to get him up. He fell on top of her and trapped her foot. She thinks this is how she fell. Minimal memory from around 9 PM then family member arrived early in the night when they could find a phone.   Foot trapped - R LE trapped.  Hit head on the R side.  Shoulder on the right side hurts quite a bit.  R LE extensively bruised and painful.  Spent 30-40 minutes trying to get free.   R lower leg hurting quite a lot.  Declined ER eval from EMT recommendations due to lack of care for husband.  Check TSH. She also reports that she has not been taking her thyroid medication as prescribed. She has been cutting her thyroid tablets and half so that they will last longer. Lab Results  Component Value Date   TSH 6.79* 01/26/2014     ? Concussion: She also has been somewhat hazy according  to her daughter. When asked her questions specifically directly, she somewhat answers tangentially. She did have a headache, which has subsided. She has not had any significant nausea or emotional lability. She is not sure about feeling dizzy. She is now using a walker, and normally she does not. This is primarily secondary to pain. She does not feel safe, she she is using a walker. At this point her headache has resolved. She has no nausea. She has no visual changes. She denies any photophobia or photophobia.  [facility] - branded Synthroid   Patient Active Problem List   Diagnosis Date Noted  . UNSPECIFIED HYPOTHYROIDISM 12/31/2008  . HYPERLIPIDEMIA 12/31/2008  . HYPERTENSION 12/31/2008  . ALLERGIC RHINITIS 12/31/2008  . OSTEOARTHRITIS 12/31/2008    Past Medical History  Diagnosis Date  . Pollen allergies   . HTN (hypertension)   . Osteoarthritis   . Hyperlipidemia     Past Surgical History  Procedure Laterality Date  . Right knee replacement  2005  . Tendon repair  1979    left wrist  . Partial hysterectomy  1985    Left ovary remains for endometriosis and fibroids  . Exploratory laparotomy    . Retinal detachment surgery    . Cataract extraction, bilateral      History   Social History  . Marital Status: Married  Spouse Name: N/A    Number of Children: 2  . Years of Education: N/A   Occupational History  . retired(workers Building services engineercomp underwriter)    Social History Main Topics  . Smoking status: Never Smoker   . Smokeless tobacco: Never Used  . Alcohol Use: Yes     Comment: occassionally a glass a wine  . Drug Use: No  . Sexual Activity: Not on file   Other Topics Concern  . Not on file   Social History Narrative   Regular exercise--yes--bicycles 3 to 7 times per week    Family History  Problem Relation Age of Onset  . Osteoporosis Mother   . Arthritis Mother   . Alcohol abuse Father   . COPD Father   . Cancer Father     esophageal  . Heart attack Father [id number]     massive  . Coronary artery disease Paternal Grandfather     Allergies  Allergen Reactions  . Meperidine Hcl     REACTION: Nausea    Medication list has been reviewed and updated.  Review of Systems:   GEN: No acute illnesses, no fevers, chills. GI: No n/v/d, eating normally Pulm: No SOB O/w neg  Objective:   Physical Examination: BP 140/90  Pulse 109  Temp(Src) 98.4 F (36.9 C) (Oral)  Ht 5\' 6"  (1.676 m)  Wt 201 lb (91.173 kg)  BMI 32.46 kg/m2  Ideal Body Weight: Weight in (lb) to have BMI = 25: 154.6   GEN: WDWN, NAD, Non-toxic, Alert & Oriented x 3 HEENT: Atraumatic, Normocephalic.  Ears and Nose: No external deformity. EXTR: No clubbing/cyanosis/edema NEURO: Normal gait.  PSYCH: Normally interactive. Conversant. Not depressed or anxious appearing.  Calm demeanor.    Right shoulder is with normal range of motion. Strength is 5/5 throughout. Nontender throughout on examination.  Legs bilaterally are nontender throughout with 2 areas of healing ecchymosis.  Laboratory and Imaging Data: Dg Shoulder Right  01/26/2014   CLINICAL DATA:  Right shoulder plain, trauma 3 days ago  EXAM: RIGHT SHOULDER - 2+ VIEW  COMPARISON:  None.  FINDINGS: Three views of right shoulder submitted. No acute fracture or subluxation. Glenohumeral joint is preserved.  IMPRESSION: Negative.   Electronically Signed   By: Natasha MeadLiviu  Pop M.D.   On: 01/26/2014 15:25   Dg Ankle Complete Right  01/26/2014   CLINICAL DATA:  Right ankle injury 3 days ago.  Pain.  EXAM: RIGHT ANKLE - COMPLETE 3+ VIEW  COMPARISON:  07/09/2013  FINDINGS: No fracture. No bone lesion. Ankle mortise is normally space and aligned. There is soft tissue swelling most notable laterally.  IMPRESSION: No fracture or dislocation.   Electronically Signed   By: Amie Portlandavid  Ormond M.D.   On: 01/26/2014 15:59    Assessment & Plan:   Right shoulder pain  Right ankle pain  Closed head injury with concussion  Fully improved.   Clear  to drive.  Shoulder improved.  Patient's Medications  New Prescriptions   No medications on file  Previous Medications   BENAZEPRIL (LOTENSIN) 10 MG TABLET    Take 1 tablet (10 mg total) by mouth daily.   CALCIUM CARBONATE-VITAMIN D (CALCIUM 600+D) 600-400 MG-UNIT PER TABLET    Take 1 tablet by mouth daily.     SIMVASTATIN (ZOCOR) 40 MG TABLET    Take 0.5 tablets (20 mg total) by mouth at bedtime.   SYNTHROID 125 MCG TABLET    TAKE 1 TABLET (125 MCG TOTAL) BY MOUTH DAILY.  Modified  Medications   No medications on file  Discontinued Medications   No medications on file   There are no Patient Instructions on file for this visit.  Signed,  Elpidio Galea. Yitzhak Awan, MD, CAQ Sports Medicine  [employer] at [facility] [address] [place] [address] Phone: [phone] Fax: [phone]

## 2014-02-12 NOTE — Progress Notes (Signed)
Pre visit review using our clinic review tool, if applicable. No additional management support is needed unless otherwise documented below in the visit note. 

## 2014-02-18 ENCOUNTER — Telehealth: Payer: Self-pay | Admitting: Family Medicine

## 2014-02-18 DIAGNOSIS — Z780 Asymptomatic menopausal state: Secondary | ICD-10-CM

## 2014-02-18 DIAGNOSIS — E2839 Other primary ovarian failure: Secondary | ICD-10-CM

## 2014-02-18 DIAGNOSIS — Z1231 Encounter for screening mammogram for malignant neoplasm of breast: Secondary | ICD-10-CM

## 2014-02-18 NOTE — Telephone Encounter (Signed)
I think there has to be an order for the bone density.  Will have Dr. Ermalene SearingBedsole place order in Epic.

## 2014-02-18 NOTE — Telephone Encounter (Signed)
She should call herself and schedule her mammogram.

## 2014-02-18 NOTE — Telephone Encounter (Signed)
Pt came by the office and says it is time to schedule her mammo and bone density at [facility]. She ask if an order could be put in so she could schedule. Please advise.

## 2014-02-19 NOTE — Telephone Encounter (Signed)
order for DEXa entered.

## 2014-02-19 NOTE — Telephone Encounter (Signed)
Left message for Leah Arnold that order for bone density has been ordered.  She should be able to call and schedule her mammogram and bone density with [facility].

## 2014-02-19 NOTE — Addendum Note (Signed)
Addended by: Damita LackLORING, DONNA S on: 02/19/2014 11:32 AM   Modules accepted: Orders

## 2014-02-19 NOTE — Telephone Encounter (Signed)
Bone Density Order faxed to [facility] at [phone].

## 2014-03-12 ENCOUNTER — Telehealth: Payer: Self-pay

## 2014-03-12 NOTE — Telephone Encounter (Signed)
Is there a form  ? H and P ( history and physical) or FL2 or do they need copy of  last CPX.

## 2014-03-12 NOTE — Telephone Encounter (Signed)
Rinaldo Cloudamela pts daughter trying to assist parents into moving [place] [facility]; Mrs Mack GuiseLoy may be considered for independent living and [place] request H&P form sent to [facility], [employer] [facility] admission director or can call Mrs Mack GuiseLoy to pick up H&P.Rinaldo CloudPamela request cb.

## 2014-03-13 NOTE — Telephone Encounter (Signed)
Dr. Alphonsus SiasLetvak, Can you advise Dr. Ermalene SearingBedsole on this.  I don't know the answer to her question.

## 2014-03-13 NOTE — Telephone Encounter (Signed)
There is a form to fill out for those going into independent living They can get that and drop it off for us I usually fill out part and attach the last office visit note for ease

## 2014-03-13 NOTE — Telephone Encounter (Signed)
Pamela (daughter)  notified records have been faxed to Cathy at [facility]. 

## 2014-03-13 NOTE — Telephone Encounter (Signed)
Records faxed to Cathy Maines at [facility] [phone]. 

## 2014-04-02 ENCOUNTER — Telehealth: Payer: Self-pay

## 2014-04-02 MED ORDER — BENAZEPRIL HCL 10 MG PO TABS: 10.0000 mg | ORAL_TABLET | Freq: Every day | ORAL | Status: AC

## 2014-04-02 MED ORDER — SIMVASTATIN 40 MG PO TABS: 20.0000 mg | ORAL_TABLET | Freq: Every day | ORAL | Status: AC

## 2014-04-02 NOTE — Telephone Encounter (Signed)
Okay to refill x 1 but needs appt for CPX with fastoing labs prior before further refills.

## 2014-04-02 NOTE — Telephone Encounter (Signed)
Pt left note requesting refill benazepril and simvastatin to [facility]. Pt last saw Dr Ermalene Searing and last labs on 08/13/2012. Pt saw Dr Patsy Lager on 02/12/14 for shoulder pain. No future appt scheduled. Is it OK to refill?

## 2014-04-07 ENCOUNTER — Ambulatory Visit: Payer: Self-pay | Admitting: Family Medicine

## 2014-04-07 ENCOUNTER — Encounter: Payer: Self-pay | Admitting: Family Medicine

## 2014-04-07 NOTE — Telephone Encounter (Signed)
Refills were sent in to [facility] on 04/02/2014.  Patient is scheduled for CPX 05/14/2014.

## 2014-05-06 ENCOUNTER — Telehealth: Payer: Self-pay | Admitting: Family Medicine

## 2014-05-06 DIAGNOSIS — E785 Hyperlipidemia, unspecified: Secondary | ICD-10-CM

## 2014-05-06 DIAGNOSIS — E039 Hypothyroidism, unspecified: Secondary | ICD-10-CM

## 2014-05-06 NOTE — Telephone Encounter (Signed)
Message copied by Excell SeltzerBEDSOLE, Othel Dicostanzo E on Wed May 06, 2014 10:17 PM ------      Message from: Alvina ChouWALSH, TERRI J      Created: Tue Apr 28, 2014 12:42 PM      Regarding: Lab orders for Thursday, 7.9.15       Patient is scheduled for CPX labs, please order future labs, Thanks , Terri       ------

## 2014-05-07 ENCOUNTER — Other Ambulatory Visit (INDEPENDENT_AMBULATORY_CARE_PROVIDER_SITE_OTHER): Payer: Medicare PPO

## 2014-05-07 DIAGNOSIS — E039 Hypothyroidism, unspecified: Secondary | ICD-10-CM

## 2014-05-07 DIAGNOSIS — E785 Hyperlipidemia, unspecified: Secondary | ICD-10-CM

## 2014-05-07 LAB — COMPREHENSIVE METABOLIC PANEL
ALBUMIN: 3.8 g/dL (ref 3.5–5.2)
ALK PHOS: 49 U/L (ref 39–117)
ALT: 14 U/L (ref 0–35)
AST: 18 U/L (ref 0–37)
BUN: 25 mg/dL — ABNORMAL HIGH (ref 6–23)
CHLORIDE: 107 meq/L (ref 96–112)
CO2: 23 mEq/L (ref 19–32)
Calcium: 9.3 mg/dL (ref 8.4–10.5)
Creatinine, Ser: 0.9 mg/dL (ref 0.4–1.2)
GFR: 64.92 mL/min (ref >60.00)
Glucose, Bld: 94 mg/dL (ref 70–99)
Potassium: 4.3 mEq/L (ref 3.5–5.1)
SODIUM: 140 meq/L (ref 135–145)
TOTAL PROTEIN: 6.5 g/dL (ref 6.0–8.3)
Total Bilirubin: 0.6 mg/dL (ref 0.2–1.2)

## 2014-05-07 LAB — LIPID PANEL
CHOL/HDL RATIO: 3
Cholesterol: 178 mg/dL (ref 0–200)
HDL: 51.5 mg/dL (ref >39.00)
LDL CALC: 115 mg/dL — AB (ref 0–99)
NONHDL: 126.5
TRIGLYCERIDES: 59 mg/dL (ref 0.0–149.0)
VLDL: 11.8 mg/dL (ref 0.0–40.0)

## 2014-05-07 LAB — TSH: TSH: 3.21 u[IU]/mL (ref 0.35–4.50)

## 2014-05-14 ENCOUNTER — Ambulatory Visit (INDEPENDENT_AMBULATORY_CARE_PROVIDER_SITE_OTHER): Payer: Medicare PPO | Admitting: Family Medicine

## 2014-05-14 ENCOUNTER — Encounter: Payer: Self-pay | Admitting: Family Medicine

## 2014-05-14 VITALS — BP 140/88 | HR 78 | Temp 98.5°F | Ht 64.0 in | Wt 193.5 lb

## 2014-05-14 DIAGNOSIS — Z66 Do not resuscitate: Secondary | ICD-10-CM

## 2014-05-14 DIAGNOSIS — Z23 Encounter for immunization: Secondary | ICD-10-CM

## 2014-05-14 DIAGNOSIS — M25569 Pain in unspecified knee: Secondary | ICD-10-CM

## 2014-05-14 DIAGNOSIS — Z0000 Encounter for general adult medical examination without abnormal findings: Secondary | ICD-10-CM

## 2014-05-14 DIAGNOSIS — E785 Hyperlipidemia, unspecified: Secondary | ICD-10-CM

## 2014-05-14 DIAGNOSIS — M25562 Pain in left knee: Secondary | ICD-10-CM

## 2014-05-14 DIAGNOSIS — H9193 Unspecified hearing loss, bilateral: Secondary | ICD-10-CM | POA: Insufficient documentation

## 2014-05-14 DIAGNOSIS — E039 Hypothyroidism, unspecified: Secondary | ICD-10-CM

## 2014-05-14 DIAGNOSIS — I10 Essential (primary) hypertension: Secondary | ICD-10-CM

## 2014-05-14 DIAGNOSIS — H9190 Unspecified hearing loss, unspecified ear: Secondary | ICD-10-CM

## 2014-05-14 DIAGNOSIS — M25561 Pain in right knee: Secondary | ICD-10-CM | POA: Insufficient documentation

## 2014-05-14 MED ORDER — MELOXICAM 15 MG PO TABS: 15.0000 mg | ORAL_TABLET | Freq: Every day | ORAL | Status: DC

## 2014-05-14 NOTE — Assessment & Plan Note (Signed)
Trial of meloxicam for pain. Continue exercise. Consider X-rays and steroid injection if not improving.

## 2014-05-14 NOTE — Progress Notes (Signed)
Subjective:    Patient ID: Leah Arnold, female    DOB: 08-29-1946, 68 y.o.   MRN: [id number]  HPI I have personally reviewed the Medicare Annual Wellness questionnaire and have noted 1. The patient's medical and social history 2. Their use of alcohol, tobacco or illicit drugs 3. Their current medications and supplements 4. The patient's functional ability including ADL's, fall risks, home safety risks and hearing or visual             impairment. 5. Diet and physical activities 6. Evidence for depression or mood disorders The patients weight, height, BMI and visual acuity have been recorded in the chart I have made referrals, counseling and provided education to the patient based review of the above and I have provided the pt with a written personalized care plan for preventive services.  Hypertension:   Borderline control here on lotensin BP Readings from Last 3 Encounters:  05/14/14 140/88  02/12/14 140/90  01/26/14 106/68  Using medication without problems or lightheadedness: None Chest pain with exertion:None Edema:None Short of breath:None Average home BPs:120/70s, she has some white coat HTN Other issues:  Elevated Cholesterol:  LDL at goal on 40 mg daily simvastatin Lab Results  Component Value Date   CHOL 178 05/07/2014   HDL 51.50 05/07/2014   LDLCALC 115* 05/07/2014   TRIG 59.0 05/07/2014   CHOLHDL 3 05/07/2014  Using medications without problems:none Muscle aches: None Diet compliance: moderate Exercise: Stationary bicycle 3 days a week Other complaints:  Hypothyroidism:  On synthroid 125 mcg daily, stable Lab Results  Component Value Date   TSH 3.21 05/07/2014    She has been using a lot of aleve 4 tabs a day for 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 Helps minimally. X-rays long ago showed OA. No recent injury.      Review of Systems  Constitutional: Negative for fever and fatigue.  HENT: Negative for ear pain.   Eyes: Negative for pain.  Respiratory: Negative for chest tightness  and shortness of breath.   Cardiovascular: Negative for chest pain, palpitations and leg swelling.  Gastrointestinal: Negative for abdominal pain.  Genitourinary: Negative for dysuria.       Objective:   Physical Exam  Constitutional: Vital signs are normal. She appears well-developed and well-nourished. She is cooperative.  Non-toxic appearance. She does not appear ill. No distress.  HENT:  Head: Normocephalic.  Right Ear: Hearing, tympanic membrane, external ear and ear canal normal.  Left Ear: Hearing, tympanic membrane, external ear and ear canal normal.  Nose: Nose normal.  Eyes: Conjunctivae, EOM and lids are normal. Pupils are equal, round, and reactive to light. Lids are everted and swept, no foreign bodies found.  Neck: Trachea normal and normal range of motion. Neck supple. Carotid bruit is not present. No mass and no thyromegaly present.  Cardiovascular: Normal rate, regular rhythm, S1 normal, S2 normal, normal heart sounds and intact distal pulses.  Exam reveals no gallop.   No murmur heard. Pulmonary/Chest: Effort normal and breath sounds normal. No respiratory distress. She has no wheezes. She has no rhonchi. She has no rales.  Abdominal: Soft. Normal appearance and bowel sounds are normal. She exhibits no distension, no fluid wave, no abdominal bruit and no mass. There is no hepatosplenomegaly. There is no tenderness. There is no rebound, no guarding and no CVA tenderness. No hernia.  Genitourinary: No breast swelling, tenderness, discharge or bleeding. Pelvic exam was performed with patient supine.  Lymphadenopathy:    She has no cervical  adenopathy.    She has no axillary adenopathy.  Neurological: She is alert. She has normal strength. No cranial nerve deficit or sensory deficit.  Skin: Skin is warm, dry and intact. No rash noted.  Psychiatric: Her speech is normal and behavior is normal. Judgment normal. Her mood appears not anxious. Cognition and memory are normal. She  does not exhibit a depressed mood.          Assessment & Plan:  The patient's preventative maintenance and recommended screening tests for an annual wellness exam were reviewed in full today. Brought up to date unless services declined.  Counselled on the importance of diet, exercise, and its role in overall health and mortality. The patient's FH and SH was reviewed, including their home life, tobacco status, and drug and alcohol status.   Vaccines: Tdap 2005, Pneumovax 2010, due for shingles, given prevnar. Colon: 02/2009 nml per pt, repeat due in 2020 PAP/DVE:  not indicated, partial hysterectomy and now > age 68, No family history of ovarian cancer, asymptomatic. DEXA: 03/2014 osteopenia Mammo: 03/2014  nml Nonsmoker

## 2014-05-14 NOTE — Patient Instructions (Addendum)
Trial of meloxicam for bilateral knee pain.  Look into shingles vaccine.  Stop at front desk for referral

## 2014-05-14 NOTE — Addendum Note (Signed)
Addended by: Damita LackLORING, DONNA S on: 05/14/2014 04:14 PM   Modules accepted: Orders

## 2014-05-14 NOTE — Progress Notes (Signed)
Pre visit review using our clinic review tool, if applicable. No additional management support is needed unless otherwise documented below in the visit note. 

## 2014-05-15 ENCOUNTER — Telehealth: Payer: Self-pay | Admitting: Family Medicine

## 2014-05-15 NOTE — Telephone Encounter (Signed)
Relevant patient education assigned to patient using Emmi. ° °

## 2014-06-08 ENCOUNTER — Encounter: Payer: Self-pay | Admitting: Family Medicine

## 2014-06-09 ENCOUNTER — Other Ambulatory Visit: Payer: Self-pay | Admitting: Family Medicine

## 2014-06-12 ENCOUNTER — Other Ambulatory Visit: Payer: Self-pay | Admitting: Family Medicine

## 2014-06-12 NOTE — Telephone Encounter (Signed)
Electronic refill request. Last Filled:   30 tablet 0 RF on   05/14/2014.  Please advise.

## 2014-06-16 ENCOUNTER — Telehealth: Payer: Self-pay | Admitting: Family Medicine

## 2014-06-16 MED ORDER — SYNTHROID 125 MCG PO TABS: ORAL_TABLET | ORAL | Status: AC

## 2014-06-16 NOTE — Telephone Encounter (Signed)
Refill ended. Pt in office with husband.

## 2014-07-13 ENCOUNTER — Other Ambulatory Visit: Payer: Self-pay | Admitting: Family Medicine

## 2014-09-23 ENCOUNTER — Telehealth: Payer: Self-pay

## 2014-09-23 NOTE — Telephone Encounter (Signed)
Change in status.  Pt needs urgent appt in office. plz schedule Friday with me or Monday with PCP. No mention of dementia/memory trouble in our medical records. FL2 form started, in Kim's box
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
## 2014-09-24 NOTE — Telephone Encounter (Signed)
This is unusual for pt . She is usually handling all medical issues for her husband. Needs appt ASAP

## 2014-09-25 NOTE — Telephone Encounter (Signed)
Noted. appt scheduled Monday.
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
## 2014-09-28 ENCOUNTER — Ambulatory Visit: Payer: Medicare PPO | Admitting: Family Medicine

## 2014-09-30 ENCOUNTER — Ambulatory Visit (INDEPENDENT_AMBULATORY_CARE_PROVIDER_SITE_OTHER): Payer: Medicare PPO | Admitting: Family Medicine

## 2014-09-30 ENCOUNTER — Encounter: Payer: Self-pay | Admitting: Family Medicine

## 2014-09-30 VITALS — BP 120/82 | HR 77 | Temp 98.2°F | Ht 64.0 in | Wt 206.8 lb

## 2014-09-30 DIAGNOSIS — Z022 Encounter for examination for admission to residential institution: Secondary | ICD-10-CM

## 2014-09-30 DIAGNOSIS — N3001 Acute cystitis with hematuria: Secondary | ICD-10-CM

## 2014-09-30 DIAGNOSIS — R4182 Altered mental status, unspecified: Secondary | ICD-10-CM

## 2014-09-30 DIAGNOSIS — N390 Urinary tract infection, site not specified: Secondary | ICD-10-CM | POA: Insufficient documentation

## 2014-09-30 LAB — POCT URINALYSIS DIPSTICK
Bilirubin, UA: NEGATIVE
Glucose, UA: NEGATIVE
Nitrite, UA: POSITIVE
PH UA: 6.5
Spec Grav, UA: 1.025
UROBILINOGEN UA: 0.2

## 2014-09-30 MED ORDER — CIPROFLOXACIN HCL 500 MG PO TABS: 500.0000 mg | ORAL_TABLET | Freq: Two times a day (BID) | ORAL | Status: AC

## 2014-09-30 NOTE — Assessment & Plan Note (Addendum)
Paperwork reviewed with family and pt. Completed in detail.  Daughter hopes to have her move and admitted to AL this weekend.

## 2014-09-30 NOTE — Patient Instructions (Addendum)
We recommend labs and CT scan  for memory changes., call if you change you mind about these.  Start and complete antibiotics for urine infection.  We will call with urine culture results.

## 2014-09-30 NOTE — Addendum Note (Signed)
Addended byKerby Nora: Leah Arnold on: 09/30/2014 02:09 PM   Modules accepted: Orders

## 2014-09-30 NOTE — Assessment & Plan Note (Signed)
Send urine for culture. Treat with high dose cipro x 7 days.

## 2014-09-30 NOTE — Progress Notes (Signed)
Pre visit review using our clinic review tool, if applicable. No additional management support is needed unless otherwise documented below in the visit note. 

## 2014-09-30 NOTE — Assessment & Plan Note (Signed)
Concerning given rapid decline. Hopefull due only to UTI in elderly. Recommended lab eval as well as CT given falls in last 6 months, but pt refuses. If she is not improving  with antibiotics, I would definitely recommend further once she arrives in [place].
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
## 2014-09-30 NOTE — Addendum Note (Signed)
Addended by: Damita LackLORING, DONNA S on: 09/30/2014 02:15 PM   Modules accepted: Orders

## 2014-10-02 LAB — URINE CULTURE: Colony Count: 50000

## 2015-03-15 IMAGING — CR DG SHOULDER 2+V*R*
3 series · 3 of 3 positions shown · non-contrast
Comparison: None.

CLINICAL DATA: Right shoulder plain, trauma 3 days ago

EXAM:
RIGHT SHOULDER - 2+ VIEW

[view not recorded (1 of 3)]
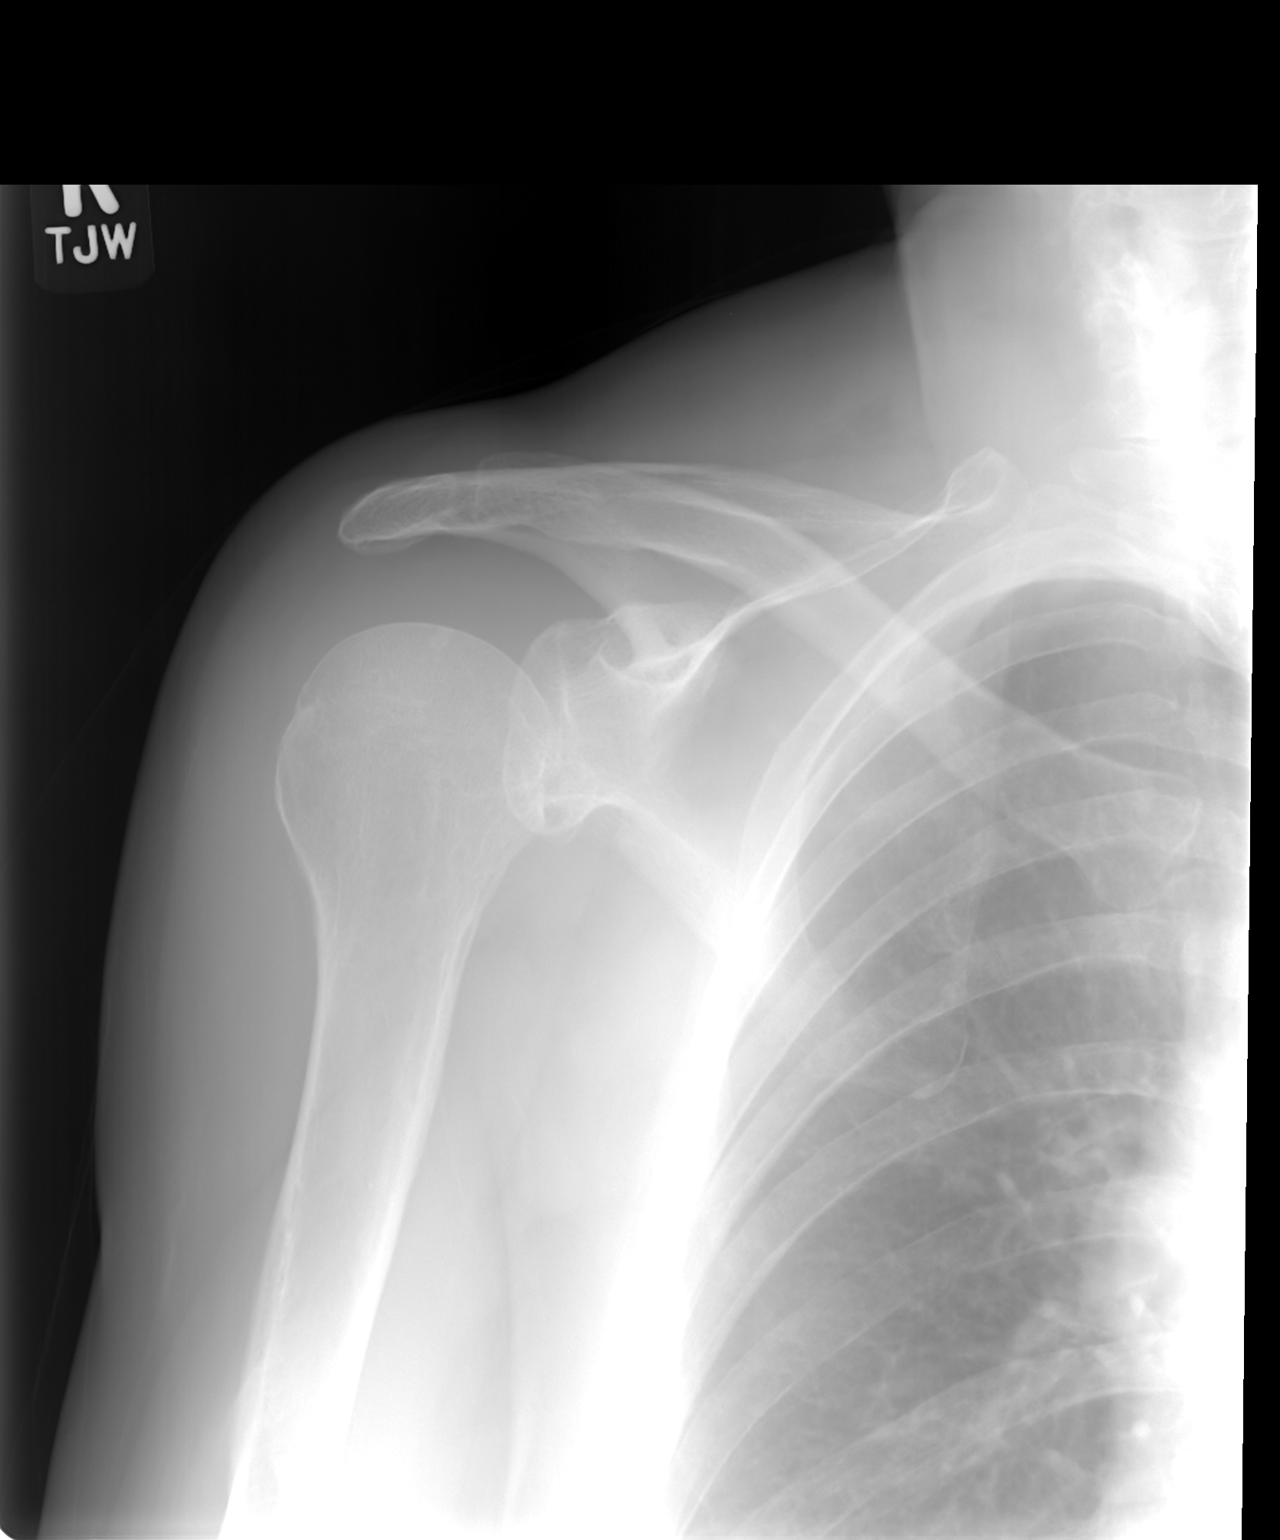

[view not recorded (2 of 3)]
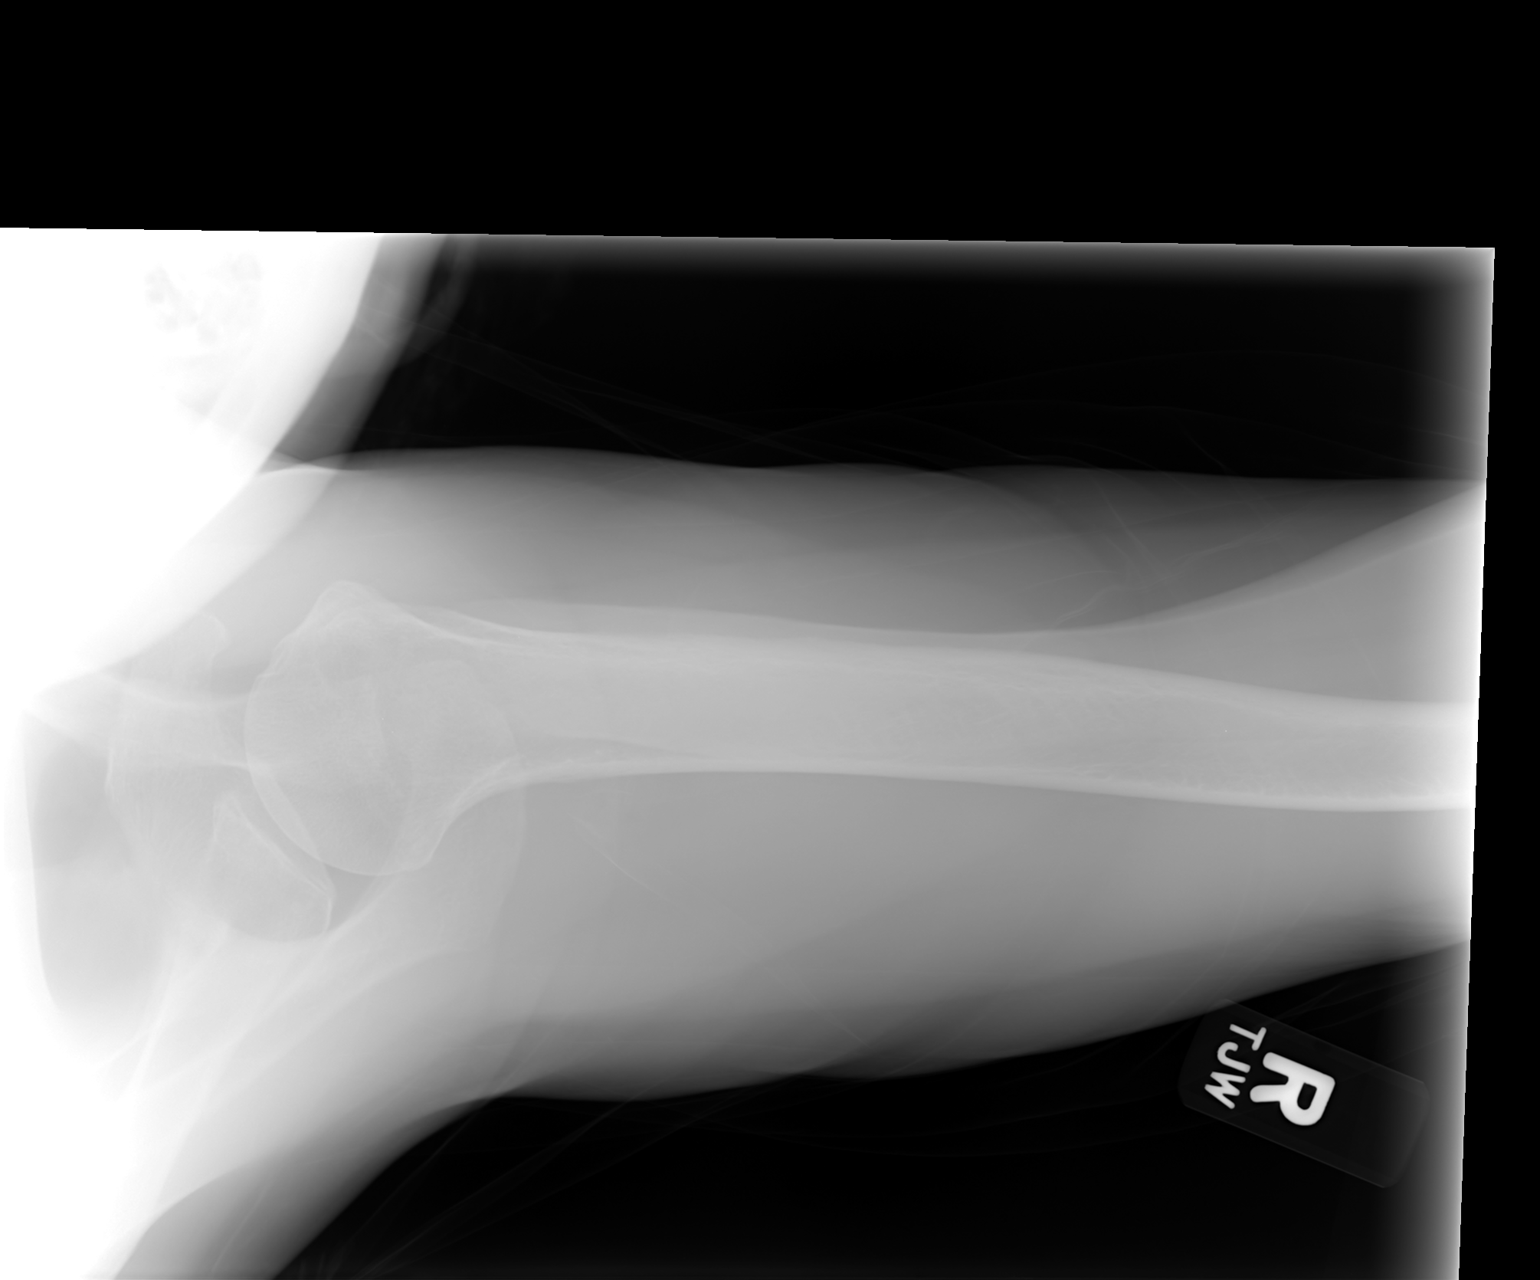

[view not recorded (3 of 3)]
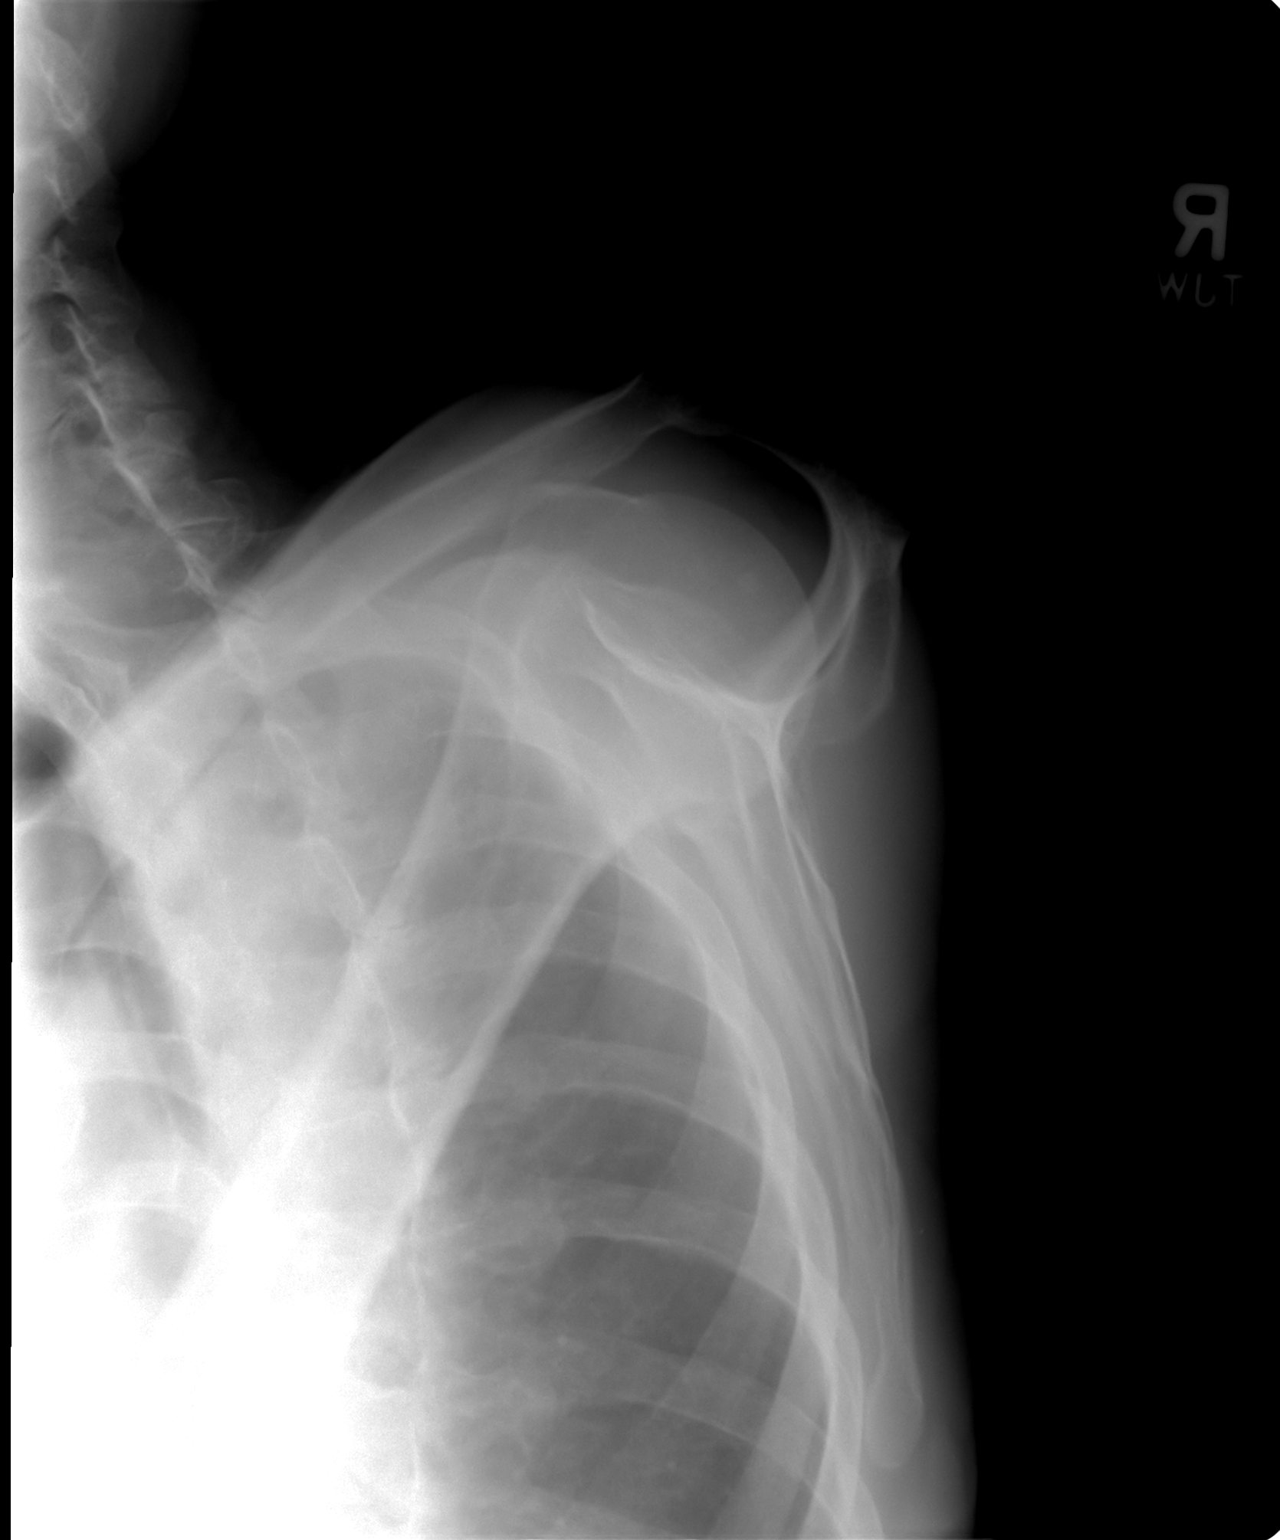

[3 of 3 positions shown; findings below may reference images not displayed]

FINDINGS: Three views of right shoulder submitted. No acute fracture or
subluxation. Glenohumeral joint is preserved.
IMPRESSION: Negative.

## 2015-03-15 IMAGING — CR DG ANKLE COMPLETE 3+V*R*
2 series · 2 of 2 positions shown · non-contrast
Comparison: 07/09/2013

CLINICAL DATA: Right ankle injury 3 days ago.  Pain.

EXAM:
RIGHT ANKLE - COMPLETE 3+ VIEW

[view not recorded (1 of 2)]
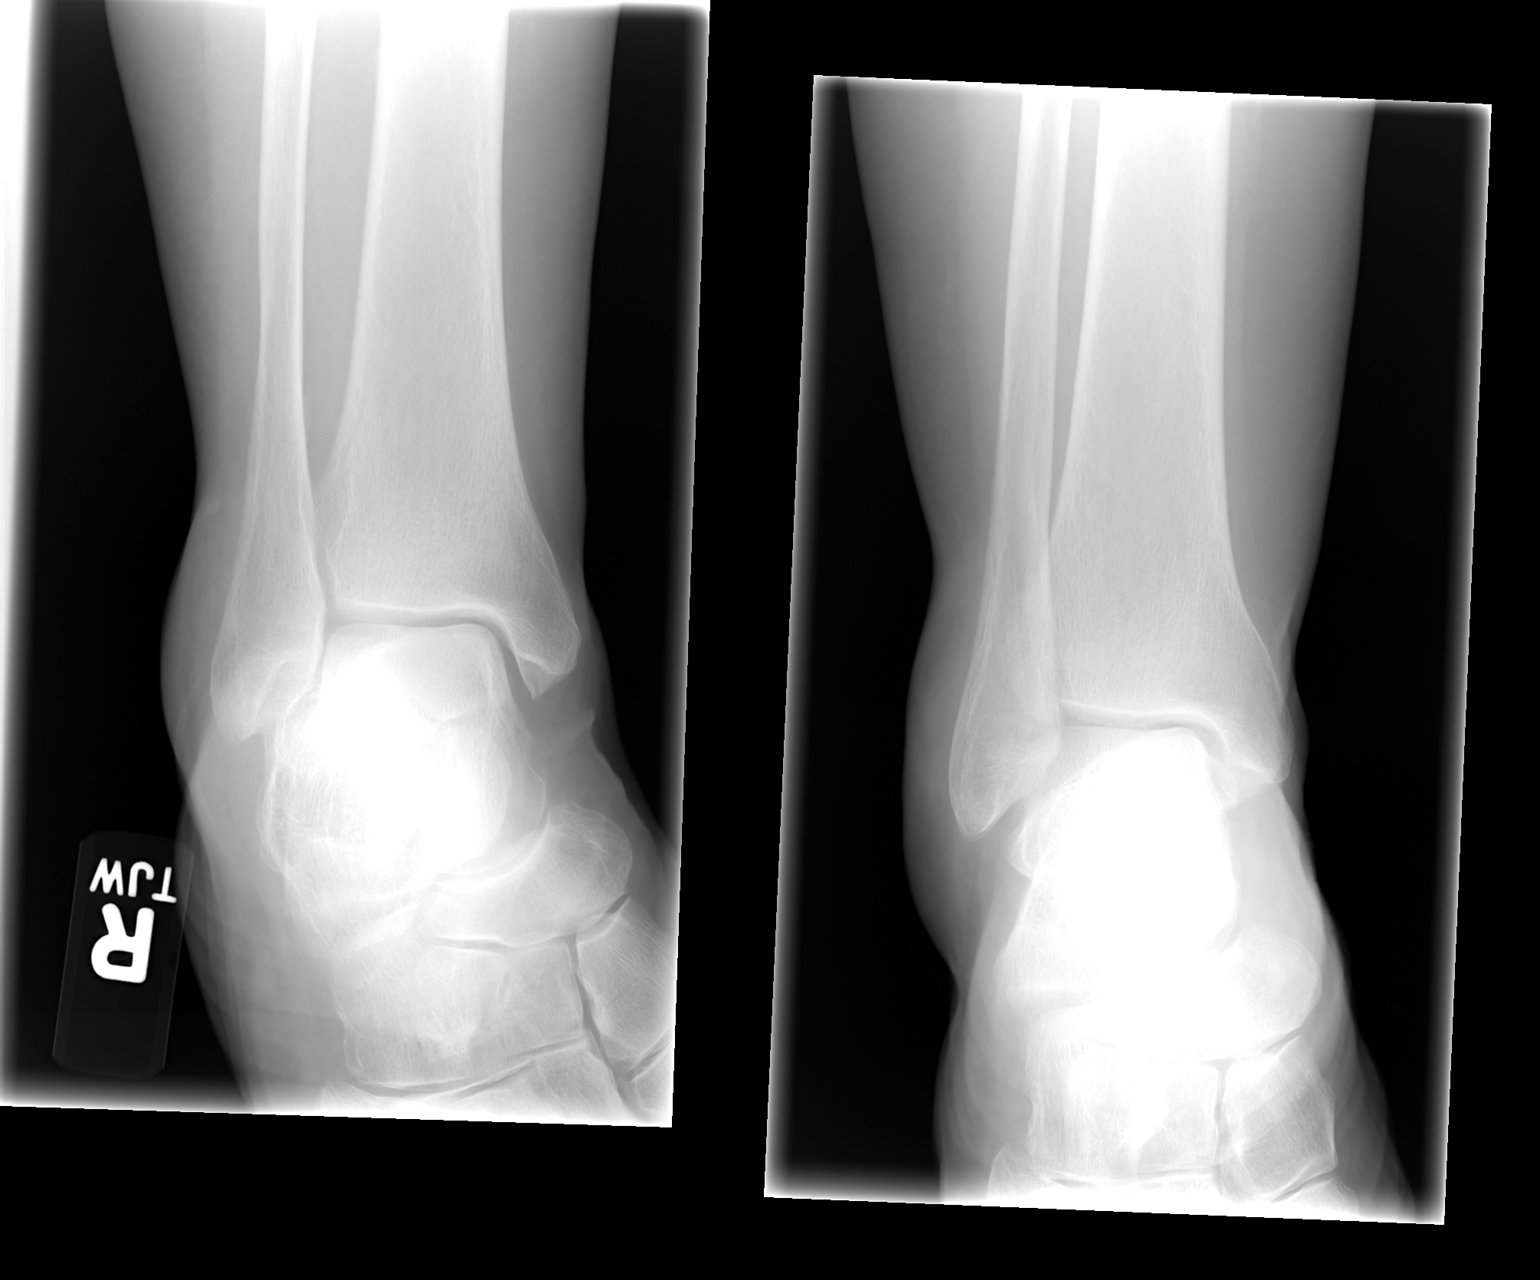

[view not recorded (2 of 2)]
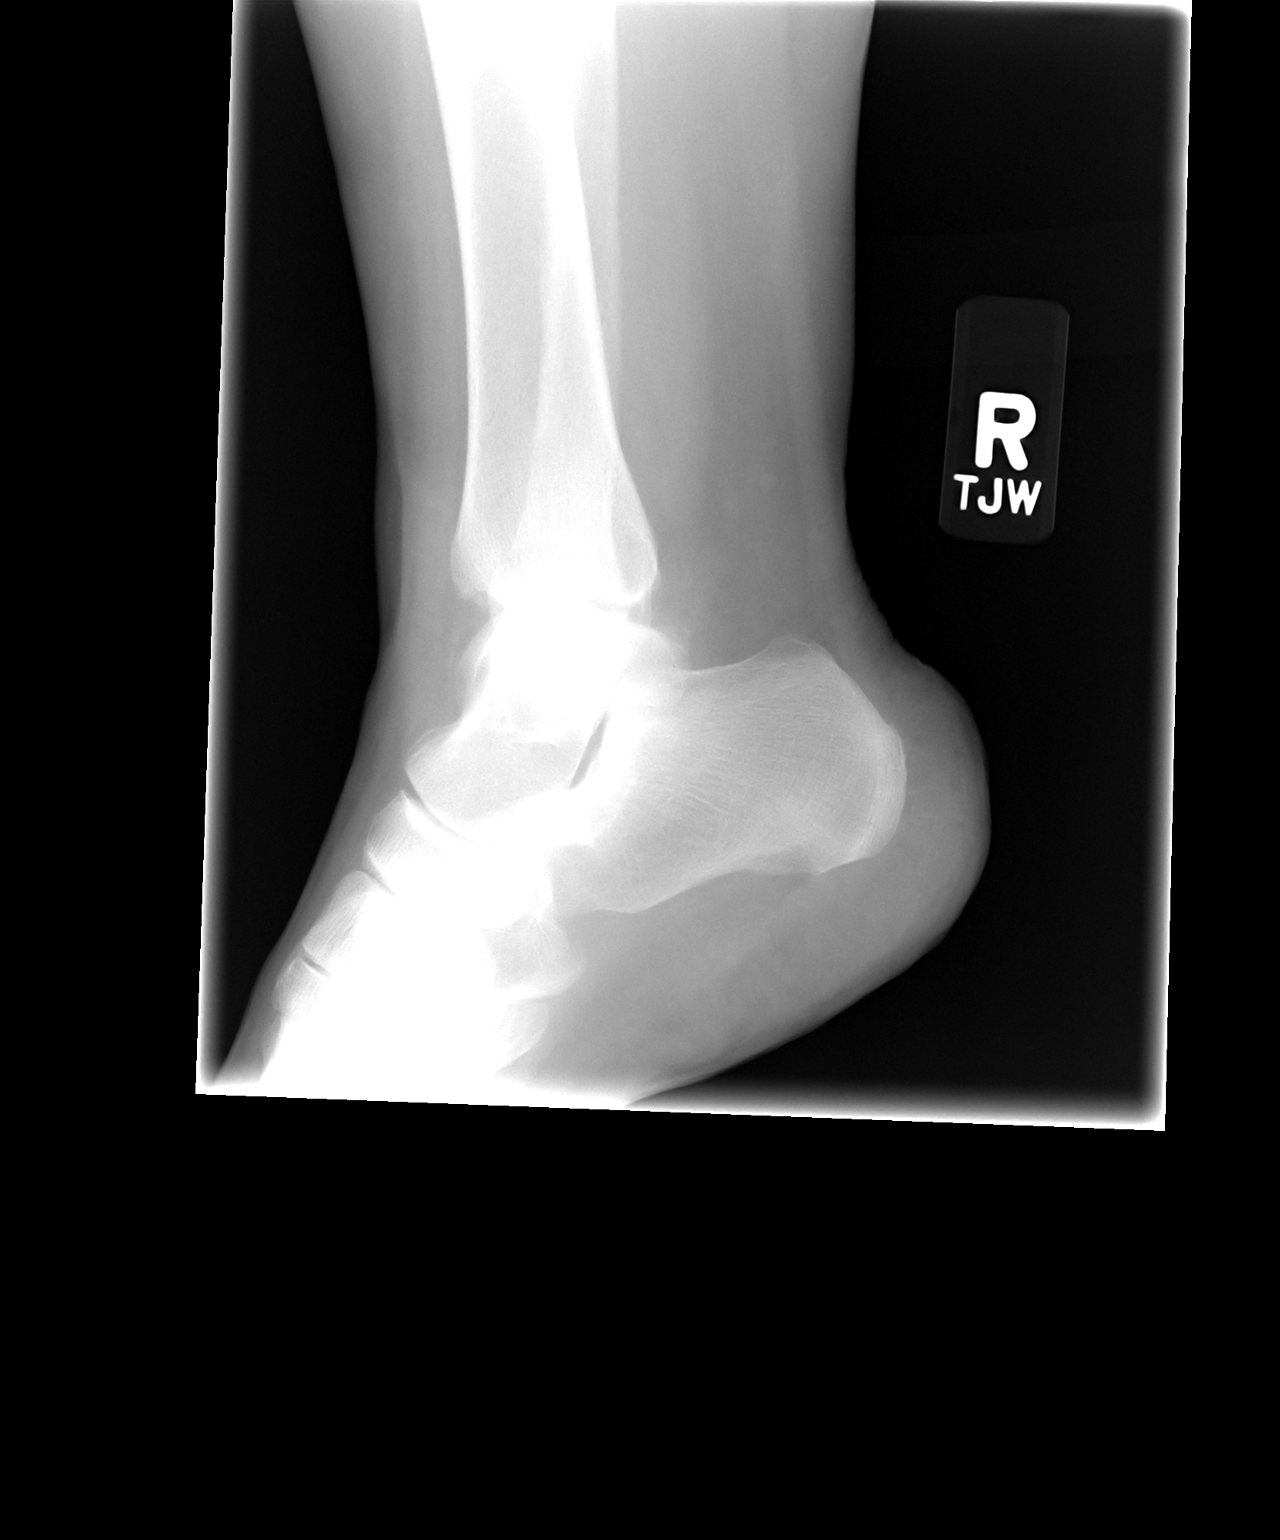

[2 of 2 positions shown; findings below may reference images not displayed]

FINDINGS: No fracture. No bone lesion. Ankle mortise is normally space and
aligned. There is soft tissue swelling most notable laterally.
IMPRESSION: No fracture or dislocation.

## 2015-03-31 DEATH — deceased
